# Patient Record
Sex: Female | Born: 1965
Health system: Southern US, Community
[De-identification: ages and names within clinical notes are randomized; demographics above are authoritative.]

## PROBLEM LIST (undated history)

## (undated) DIAGNOSIS — E119 Type 2 diabetes mellitus without complications: Secondary | ICD-10-CM

## (undated) DIAGNOSIS — I10 Essential (primary) hypertension: Secondary | ICD-10-CM

## (undated) HISTORY — PX: TUBAL LIGATION: SHX77

---

## 2003-02-03 ENCOUNTER — Encounter: Payer: Self-pay | Admitting: Emergency Medicine

## 2003-02-03 ENCOUNTER — Emergency Department (HOSPITAL_COMMUNITY): Admission: EM | Admit: 2003-02-03 | Discharge: 2003-02-03 | Payer: Self-pay | Admitting: Emergency Medicine

## 2003-10-12 ENCOUNTER — Emergency Department (HOSPITAL_COMMUNITY): Admission: EM | Admit: 2003-10-12 | Discharge: 2003-10-12 | Payer: Self-pay | Admitting: Emergency Medicine

## 2012-05-03 ENCOUNTER — Encounter (HOSPITAL_COMMUNITY): Payer: Self-pay | Admitting: Emergency Medicine

## 2012-05-03 ENCOUNTER — Emergency Department (HOSPITAL_COMMUNITY)
Admission: EM | Admit: 2012-05-03 | Discharge: 2012-05-03 | Disposition: A | Discharge status: Home / Self Care | Payer: Self-pay | Attending: Emergency Medicine | Admitting: Emergency Medicine

## 2012-05-03 DIAGNOSIS — I10 Essential (primary) hypertension: Secondary | ICD-10-CM | POA: Insufficient documentation

## 2012-05-03 DIAGNOSIS — R112 Nausea with vomiting, unspecified: Secondary | ICD-10-CM | POA: Insufficient documentation

## 2012-05-03 DIAGNOSIS — D649 Anemia, unspecified: Secondary | ICD-10-CM | POA: Insufficient documentation

## 2012-05-03 DIAGNOSIS — N898 Other specified noninflammatory disorders of vagina: Secondary | ICD-10-CM | POA: Insufficient documentation

## 2012-05-03 DIAGNOSIS — N939 Abnormal uterine and vaginal bleeding, unspecified: Secondary | ICD-10-CM

## 2012-05-03 HISTORY — DX: Essential (primary) hypertension: I10

## 2012-05-03 LAB — CBC WITH DIFFERENTIAL/PLATELET
Basophils Absolute: 0 10*3/uL (ref 0.0–0.1)
Basophils Relative: 0 % (ref 0–1)
Eosinophils Absolute: 0 10*3/uL (ref 0.0–0.7)
HCT: 22.4 % — ABNORMAL LOW (ref 36.0–46.0)
Hemoglobin: 6.2 g/dL — CL (ref 12.0–15.0)
Lymphs Abs: 0.7 10*3/uL (ref 0.7–4.0)
MCH: 15.9 pg — ABNORMAL LOW (ref 26.0–34.0)
MCHC: 27.7 g/dL — ABNORMAL LOW (ref 30.0–36.0)
MCV: 57.6 fL — ABNORMAL LOW (ref 78.0–100.0)
Monocytes Absolute: 0.6 10*3/uL (ref 0.1–1.0)
Neutro Abs: 7 10*3/uL (ref 1.7–7.7)
RDW: 21.5 % — ABNORMAL HIGH (ref 11.5–15.5)

## 2012-05-03 LAB — URINALYSIS, ROUTINE W REFLEX MICROSCOPIC
Bilirubin Urine: NEGATIVE
Hgb urine dipstick: NEGATIVE
Ketones, ur: NEGATIVE mg/dL
Protein, ur: NEGATIVE mg/dL
Urobilinogen, UA: 0.2 mg/dL (ref 0.0–1.0)

## 2012-05-03 LAB — COMPREHENSIVE METABOLIC PANEL
AST: 18 U/L (ref 0–37)
Albumin: 3.5 g/dL (ref 3.5–5.2)
BUN: 12 mg/dL (ref 6–23)
Calcium: 9.2 mg/dL (ref 8.4–10.5)
Creatinine, Ser: 0.99 mg/dL (ref 0.50–1.10)
Total Bilirubin: 0.2 mg/dL — ABNORMAL LOW (ref 0.3–1.2)

## 2012-05-03 LAB — LIPASE, BLOOD: Lipase: 26 U/L (ref 11–59)

## 2012-05-03 LAB — OCCULT BLOOD, POC DEVICE: Fecal Occult Bld: POSITIVE

## 2012-05-03 LAB — PREGNANCY, URINE: Preg Test, Ur: NEGATIVE

## 2012-05-03 LAB — GLUCOSE, CAPILLARY: Glucose-Capillary: 84 mg/dL (ref 70–99)

## 2012-05-03 MED ORDER — SODIUM CHLORIDE 0.9 % IV SOLN
Freq: Once | INTRAVENOUS | Status: AC
  Administered 2012-05-03: 11:00:00 via INTRAVENOUS

## 2012-05-03 MED ORDER — SODIUM CHLORIDE 0.9 % IV BOLUS (SEPSIS)
1000.0000 mL | Freq: Once | INTRAVENOUS | Status: AC
  Administered 2012-05-03: 1000 mL via INTRAVENOUS

## 2012-05-03 MED ORDER — ONDANSETRON HCL 4 MG/2ML IJ SOLN
4.0000 mg | Freq: Once | INTRAMUSCULAR | Status: AC
  Administered 2012-05-03: 4 mg via INTRAVENOUS
  Filled 2012-05-03: qty 2

## 2012-05-03 MED ORDER — MEGESTROL ACETATE 40 MG PO TABS: 80.0000 mg | ORAL_TABLET | Freq: Every day | ORAL | Status: DC

## 2012-05-03 MED ORDER — FERUMOXYTOL INJECTION 510 MG/17 ML
510.0000 mg | Freq: Once | INTRAVENOUS | Status: AC
  Administered 2012-05-03: 510 mg via INTRAVENOUS
  Filled 2012-05-03: qty 17

## 2012-05-03 MED ORDER — MORPHINE SULFATE 4 MG/ML IJ SOLN
4.0000 mg | Freq: Once | INTRAMUSCULAR | Status: AC
  Administered 2012-05-03: 4 mg via INTRAVENOUS
  Filled 2012-05-03: qty 1

## 2012-05-03 NOTE — ED Notes (Signed)
CRITICAL VALUE ALERT  Critical value received:  Hgb  Date of notification:  05/03/12  Time of notification:  1150  Critical value read back:yes  Nurse who received alert:  A. Nolen Mu, RN  MD notified (1st page):  Bebe Shaggy   Responding MD: Bebe Shaggy Time MD responded:  [phone]

## 2012-05-03 NOTE — ED Notes (Signed)
Pt ambulated around nurse's station x 1 with no distress.  edp notified.

## 2012-05-03 NOTE — ED Provider Notes (Signed)
History    This chart was scribed for Joya Gaskins, MD, MD by Smitty Pluck. The patient was seen in room APA05 and the patient's care was started at 9:26AM.   CSN: [id number]  Arrival date & time 05/03/12  1478   Chief Complaint  Patient presents with  . Nausea  . Emesis     Patient is a 46 y.o. female presenting with vomiting. The history is provided by the patient. No language interpreter was used.  Emesis  This is a new problem. The current episode started less than 1 hour ago. The problem occurs 2 to 4 times per day. The problem has been gradually improving. The emesis has an appearance of stomach contents. There has been no fever. Associated symptoms include cough. Pertinent negatives include no chills, no diarrhea and no fever.   Laurie Brown is a 46 y.o. female who presents to the Emergency Department complaining of nausea and emesis onset today. Pt reports that she has vomited 3x after persistent cough this AM.  She states that cough started after eating. Pt reports having SOB and diaphoresis. Denies chest pain, diarrhea and dysuria. Pt has hx of HTN (diagnosed 1 day ago at [facility]). Denies MI and CVA. Pt has had tubal ligation but is currently having menstrual cycle. She reports abdominal pain that she thinks is associated with her menstrual cycle. Denies any current pain.   Past Medical History  Diagnosis Date  . Hypertension     Past Surgical History  Procedure Date  . Tubal ligation     History reviewed. No pertinent family history.  History  Substance Use Topics  . Smoking status: Not on file  . Smokeless tobacco: Not on file  . Alcohol Use: No    OB History    Grav Para Term Preterm Abortions TAB SAB Ect Mult Living                  Review of Systems  Constitutional: Positive for diaphoresis. Negative for fever and chills.  Respiratory: Positive for cough.   Gastrointestinal: Positive for nausea and vomiting. Negative for diarrhea.    Genitourinary: Negative for dysuria.  All other systems reviewed and are negative.    Allergies  Review of patient's allergies indicates no known allergies.  Home Medications  No current outpatient prescriptions on file.  BP 153/86  Pulse 111  Temp 98.3 F (36.8 C) (Oral)  Resp 16  SpO2 100%  LMP 05/02/2012  Physical Exam  Nursing note and vitals reviewed. CONSTITUTIONAL: Well developed/well nourished HEAD AND FACE: Normocephalic/atraumatic EYES: EOMI/PERRL, no scleral icterus  ENMT: Mucous membranes dry NECK: supple no meningeal signs SPINE:entire spine nontender CV: S1/S2 noted, no murmurs/rubs/gallops noted LUNGS: Lungs are clear to auscultation bilaterally, no apparent distress ABDOMEN: soft, nontender, no rebound or guarding Rectal - stool color normal, no melena.  Chaperone present GU:no cva tenderness Blood noted in vault.  No active bleeding noted.   NEURO: Pt is awake/alert, moves all extremitiesx4 EXTREMITIES: pulses normal, full ROM SKIN: warm, color normal PSYCH: no abnormalities of mood noted   ED Course  Procedures  DIAGNOSTIC STUDIES: Oxygen Saturation is 100% on room air, normal by my interpretation.    COORDINATION OF CARE: 9:31 AM Discussed ED treatment with pt  10:04 AM    . ondansetron  4 mg Intravenous Once   11:05 AM Pt now having recurrent abd pain.  She reports it is in her upper abdomen.  abd exam benign but will  sends labs.  No lower abd tenderness noted  1:44 PM Pt stable, no hypotension.  She reports mild weakness for "awhile" but is stable Anemia noted.  Likely due to vag bleeding.   Rectal exam revealed no melena/blood (hem positive likely due to blood on skin from vag bleeding) D/w gyn Dr Despina Hidden requests IV iron and start megace 80mg  daily and f/u in one week Pt does not smoke, and no h/o VTE  Pt ambulatory in ED without any distress/weakness Does not require emergent transfusion at this time  MDM  Nursing notes including  past medical history and social history reviewed and considered in documentation Labs/vital reviewed and considered    Date: 05/03/2012  Rate: 94  Rhythm: normal sinus rhythm  QRS Axis: normal  Intervals: normal  ST/T Wave abnormalities: nonspecific ST changes  Conduction Disutrbances:none  Narrative Interpretation:   Old EKG Reviewed: none available at time of interpretation     I personally performed the services described in this documentation, which was scribed in my presence. The recorded information has been reviewed and considered.          Joya Gaskins, MD 05/03/12 [id number]

## 2012-05-03 NOTE — ED Notes (Signed)
Pt woke up with n/v this am.

## 2012-05-03 NOTE — ED Notes (Signed)
Called lab to check on urine pregnancy test.

## 2012-06-05 ENCOUNTER — Emergency Department (HOSPITAL_COMMUNITY)
Admission: EM | Admit: 2012-06-05 | Discharge: 2012-06-05 | Disposition: A | Discharge status: Home / Self Care | Payer: Self-pay | Attending: Emergency Medicine | Admitting: Emergency Medicine

## 2012-06-05 ENCOUNTER — Encounter (HOSPITAL_COMMUNITY): Payer: Self-pay | Admitting: *Deleted

## 2012-06-05 DIAGNOSIS — R109 Unspecified abdominal pain: Secondary | ICD-10-CM | POA: Insufficient documentation

## 2012-06-05 DIAGNOSIS — N949 Unspecified condition associated with female genital organs and menstrual cycle: Secondary | ICD-10-CM | POA: Insufficient documentation

## 2012-06-05 DIAGNOSIS — I10 Essential (primary) hypertension: Secondary | ICD-10-CM | POA: Insufficient documentation

## 2012-06-05 DIAGNOSIS — N939 Abnormal uterine and vaginal bleeding, unspecified: Secondary | ICD-10-CM

## 2012-06-05 DIAGNOSIS — N938 Other specified abnormal uterine and vaginal bleeding: Secondary | ICD-10-CM | POA: Insufficient documentation

## 2012-06-05 DIAGNOSIS — Z79899 Other long term (current) drug therapy: Secondary | ICD-10-CM | POA: Insufficient documentation

## 2012-06-05 LAB — CBC WITH DIFFERENTIAL/PLATELET
Basophils Absolute: 0 10*3/uL (ref 0.0–0.1)
Eosinophils Absolute: 0.2 10*3/uL (ref 0.0–0.7)
Hemoglobin: 8.6 g/dL — ABNORMAL LOW (ref 12.0–15.0)
Lymphocytes Relative: 25 % (ref 12–46)
MCH: 21.8 pg — ABNORMAL LOW (ref 26.0–34.0)
MCHC: 30.1 g/dL (ref 30.0–36.0)
Monocytes Relative: 7 % (ref 3–12)
Neutro Abs: 5.6 10*3/uL (ref 1.7–7.7)
Neutrophils Relative %: 66 % (ref 43–77)
RBC: 3.95 MIL/uL (ref 3.87–5.11)

## 2012-06-05 LAB — BASIC METABOLIC PANEL
Calcium: 9.5 mg/dL (ref 8.4–10.5)
GFR calc Af Amer: 81 mL/min — ABNORMAL LOW (ref >90)
GFR calc non Af Amer: 70 mL/min — ABNORMAL LOW (ref >90)
Glucose, Bld: 93 mg/dL (ref 70–99)
Potassium: 3.6 mEq/L (ref 3.5–5.1)
Sodium: 138 mEq/L (ref 135–145)

## 2012-06-05 MED ORDER — MEGESTROL ACETATE 40 MG PO TABS: ORAL_TABLET | ORAL | Status: DC

## 2012-06-05 NOTE — ED Notes (Signed)
Irregular vag bleeding since 10/18, with clots, Pain low abd.

## 2012-06-05 NOTE — ED Provider Notes (Signed)
History     CSN: [id number]  Arrival date & time 06/05/12  1544   First MD Initiated Contact with Patient 06/05/12 1637      Chief Complaint  Patient presents with  . Vaginal Bleeding    (Consider location/radiation/quality/duration/timing/severity/associated sxs/prior treatment) Patient is a 46 y.o. female presenting with vaginal bleeding. The history is provided by the patient (pt complains fo vaginal bleeding). No language interpreter was used.  Vaginal Bleeding This is a chronic problem. The current episode started more than 2 days ago. The problem occurs constantly. The problem has not changed since onset.Associated symptoms include abdominal pain. Pertinent negatives include no chest pain and no headaches. Nothing aggravates the symptoms. Nothing relieves the symptoms. She has tried nothing for the symptoms. The treatment provided no relief.    Past Medical History  Diagnosis Date  . Hypertension     Past Surgical History  Procedure Date  . Tubal ligation   . Tubal ligation     History reviewed. No pertinent family history.  History  Substance Use Topics  . Smoking status: Not on file  . Smokeless tobacco: Not on file  . Alcohol Use: No    OB History    Grav Para Term Preterm Abortions TAB SAB Ect Mult Living                  Review of Systems  Constitutional: Negative for fatigue.  HENT: Negative for congestion, sinus pressure and ear discharge.   Eyes: Negative for discharge.  Respiratory: Negative for cough.   Cardiovascular: Negative for chest pain.  Gastrointestinal: Positive for abdominal pain. Negative for diarrhea.  Genitourinary: Positive for vaginal bleeding. Negative for frequency and hematuria.  Musculoskeletal: Negative for back pain.  Skin: Negative for rash.  Neurological: Negative for seizures and headaches.  Hematological: Negative.   Psychiatric/Behavioral: Negative for hallucinations.    Allergies  Review of patient's allergies  indicates no known allergies.  Home Medications   Current Outpatient Rx  Name  Route  Sig  Dispense  Refill  . IRON 325 (65 FE) MG PO TABS   Oral   Take 1 tablet by mouth 2 (two) times daily.         . QUINAPRIL-HYDROCHLOROTHIAZIDE 20-25 MG PO TABS   Oral   Take 1 tablet by mouth daily.         Marland Kitchen VITAMIN C 500 MG PO TABS   Oral   Take 500 mg by mouth daily.         . MEGESTROL ACETATE 40 MG PO TABS      Take one pill 3 times a day for 3 days,  Then 2 times a day for 3 days,  Then once a day until finished   45 tablet   0     BP 160/104  Pulse 109  Temp 98.1 F (36.7 C) (Oral)  Resp 20  Ht 5\' 7"  (1.702 m)  Wt 224 lb (101.606 kg)  BMI 35.08 kg/m2  SpO2 100%  LMP 05/03/2012  Physical Exam  Constitutional: She is oriented to person, place, and time. She appears well-developed.  HENT:  Head: Normocephalic and atraumatic.  Eyes: Conjunctivae normal and EOM are normal. No scleral icterus.  Neck: Neck supple. No thyromegaly present.  Cardiovascular: Normal rate and regular rhythm.  Exam reveals no gallop and no friction rub.   No murmur heard. Pulmonary/Chest: No stridor. She has no wheezes. She has no rales. She exhibits no tenderness.  Abdominal: She exhibits  no distension. There is no tenderness. There is no rebound.  Musculoskeletal: Normal range of motion. She exhibits no edema.  Lymphadenopathy:    She has no cervical adenopathy.  Neurological: She is oriented to person, place, and time. Coordination normal.  Skin: No rash noted. No erythema.  Psychiatric: She has a normal mood and affect. Her behavior is normal.    ED Course  Procedures (including critical care time)  Labs Reviewed  CBC WITH DIFFERENTIAL - Abnormal; Notable for the following:    Hemoglobin 8.6 (*)     HCT 28.6 (*)     MCV 72.4 (*)     MCH 21.8 (*)     All other components within normal limits  BASIC METABOLIC PANEL - Abnormal; Notable for the following:    GFR calc non Af Amer 70  (*)     GFR calc Af Amer 81 (*)     All other components within normal limits   No results found.   1. Vaginal bleeding       MDM          Benny Lennert, MD 06/05/12 [phone]

## 2012-06-05 NOTE — ED Notes (Signed)
Pt c/o irregular vaginal bleeding x2 months. Pt states "my period comes and goes". Pt also reports passing of blood clots. Pt denies NVD, chest pain, abdominal pain, lightheadedness or weakness, and unusual urinary symptoms.

## 2012-06-26 DIAGNOSIS — N920 Excessive and frequent menstruation with regular cycle: Secondary | ICD-10-CM | POA: Insufficient documentation

## 2012-10-21 DIAGNOSIS — D250 Submucous leiomyoma of uterus: Secondary | ICD-10-CM | POA: Insufficient documentation

## 2012-10-21 DIAGNOSIS — I10 Essential (primary) hypertension: Secondary | ICD-10-CM | POA: Insufficient documentation

## 2012-10-21 DIAGNOSIS — N9489 Other specified conditions associated with female genital organs and menstrual cycle: Secondary | ICD-10-CM | POA: Insufficient documentation

## 2012-10-21 DIAGNOSIS — D649 Anemia, unspecified: Secondary | ICD-10-CM | POA: Insufficient documentation

## 2013-06-06 DIAGNOSIS — T884XXA Failed or difficult intubation, initial encounter: Secondary | ICD-10-CM | POA: Insufficient documentation

## 2014-01-01 DIAGNOSIS — N8501 Benign endometrial hyperplasia: Secondary | ICD-10-CM | POA: Insufficient documentation

## 2015-04-26 DIAGNOSIS — Z0000 Encounter for general adult medical examination without abnormal findings: Secondary | ICD-10-CM | POA: Insufficient documentation

## 2015-04-26 DIAGNOSIS — E119 Type 2 diabetes mellitus without complications: Secondary | ICD-10-CM | POA: Insufficient documentation

## 2015-06-05 ENCOUNTER — Encounter (HOSPITAL_COMMUNITY): Payer: Self-pay | Admitting: Emergency Medicine

## 2015-06-05 ENCOUNTER — Emergency Department (HOSPITAL_COMMUNITY): Payer: Medicaid Other

## 2015-06-05 ENCOUNTER — Emergency Department (HOSPITAL_COMMUNITY)
Admission: EM | Admit: 2015-06-05 | Discharge: 2015-06-05 | Disposition: A | Discharge status: Home / Self Care | Payer: Medicaid Other | Attending: Emergency Medicine | Admitting: Emergency Medicine

## 2015-06-05 DIAGNOSIS — Z9851 Tubal ligation status: Secondary | ICD-10-CM | POA: Diagnosis not present

## 2015-06-05 DIAGNOSIS — R1011 Right upper quadrant pain: Secondary | ICD-10-CM | POA: Diagnosis not present

## 2015-06-05 DIAGNOSIS — Z79899 Other long term (current) drug therapy: Secondary | ICD-10-CM | POA: Diagnosis not present

## 2015-06-05 DIAGNOSIS — I10 Essential (primary) hypertension: Secondary | ICD-10-CM | POA: Diagnosis not present

## 2015-06-05 DIAGNOSIS — R109 Unspecified abdominal pain: Secondary | ICD-10-CM

## 2015-06-05 LAB — COMPREHENSIVE METABOLIC PANEL
ALBUMIN: 3.9 g/dL (ref 3.5–5.0)
ALT: 21 U/L (ref 14–54)
AST: 20 U/L (ref 15–41)
Alkaline Phosphatase: 55 U/L (ref 38–126)
Anion gap: 8 (ref 5–15)
BUN: 15 mg/dL (ref 6–20)
CHLORIDE: 100 mmol/L — AB (ref 101–111)
CO2: 31 mmol/L (ref 22–32)
CREATININE: 1.1 mg/dL — AB (ref 0.44–1.00)
Calcium: 9.6 mg/dL (ref 8.9–10.3)
GFR calc Af Amer: >60 mL/min (ref >60)
GFR, EST NON AFRICAN AMERICAN: 59 mL/min — AB (ref >60)
GLUCOSE: 75 mg/dL (ref 65–99)
POTASSIUM: 3.4 mmol/L — AB (ref 3.5–5.1)
SODIUM: 139 mmol/L (ref 135–145)
Total Bilirubin: 0.3 mg/dL (ref 0.3–1.2)
Total Protein: 9.3 g/dL — ABNORMAL HIGH (ref 6.5–8.1)

## 2015-06-05 LAB — CBC WITH DIFFERENTIAL/PLATELET
BASOS ABS: 0 10*3/uL (ref 0.0–0.1)
BASOS PCT: 0 %
EOS ABS: 0.2 10*3/uL (ref 0.0–0.7)
EOS PCT: 3 %
HCT: 41.8 % (ref 36.0–46.0)
Hemoglobin: 13.8 g/dL (ref 12.0–15.0)
LYMPHS PCT: 30 %
Lymphs Abs: 2.3 10*3/uL (ref 0.7–4.0)
MCH: 27.2 pg (ref 26.0–34.0)
MCHC: 33 g/dL (ref 30.0–36.0)
MCV: 82.4 fL (ref 78.0–100.0)
MONO ABS: 0.8 10*3/uL (ref 0.1–1.0)
Monocytes Relative: 10 %
Neutro Abs: 4.4 10*3/uL (ref 1.7–7.7)
Neutrophils Relative %: 57 %
PLATELETS: 307 10*3/uL (ref 150–400)
RBC: 5.07 MIL/uL (ref 3.87–5.11)
RDW: 15.4 % (ref 11.5–15.5)
WBC: 7.7 10*3/uL (ref 4.0–10.5)

## 2015-06-05 LAB — LIPASE, BLOOD: LIPASE: 34 U/L (ref 11–51)

## 2015-06-05 MED ORDER — GI COCKTAIL ~~LOC~~
30.0000 mL | Freq: Once | ORAL | Status: AC
  Administered 2015-06-05: 30 mL via ORAL
  Filled 2015-06-05: qty 30

## 2015-06-05 MED ORDER — SODIUM CHLORIDE 0.9 % IV BOLUS (SEPSIS)
1000.0000 mL | Freq: Once | INTRAVENOUS | Status: AC
  Administered 2015-06-05: 1000 mL via INTRAVENOUS

## 2015-06-05 MED ORDER — FENTANYL CITRATE (PF) 100 MCG/2ML IJ SOLN
100.0000 ug | Freq: Once | INTRAMUSCULAR | Status: AC
  Administered 2015-06-05: 100 ug via INTRAVENOUS
  Filled 2015-06-05: qty 2

## 2015-06-05 NOTE — ED Provider Notes (Signed)
CSN: [id number]     Arrival date & time 06/05/15  1328 History   First MD Initiated Contact with Patient 06/05/15 1355     Chief Complaint  Patient presents with  . Abdominal Pain     (Consider location/radiation/quality/duration/timing/severity/associated sxs/prior Treatment) HPI  1 day of intermittent moderate gnawing aching pain mostly in right upper quadrant with onset at rest. Intermittently worsening to as high as 8 (where it is now) but sometimes will improve but not go away. No h/o same. No associated nausea, vomiting, diarrhea, constipation, cough, fever, shortness of breath, rashes or trauma.   Past Medical History  Diagnosis Date  . Hypertension    Past Surgical History  Procedure Laterality Date  . Tubal ligation    . Tubal ligation     No family history on file. Social History  Substance Use Topics  . Smoking status: Never Smoker   . Smokeless tobacco: None  . Alcohol Use: No   OB History    No data available     Review of Systems  Constitutional: Negative for fever, chills, activity change, appetite change and fatigue.  HENT: Negative for congestion and facial swelling.   Eyes: Negative for discharge and redness.  Respiratory: Negative for cough and shortness of breath.   Cardiovascular: Negative for chest pain.  Gastrointestinal: Positive for abdominal pain (RUQ). Negative for nausea, vomiting, diarrhea, constipation, blood in stool and abdominal distention.  Endocrine: Negative for polydipsia.  Genitourinary: Negative for dysuria and pelvic pain.  Musculoskeletal: Negative for myalgias and back pain.  Skin: Negative for rash and wound.  Neurological: Negative for headaches.  All other systems reviewed and are negative.     Allergies  Lisinopril  Home Medications   Prior to Admission medications   Medication Sig Start Date End Date Taking? Authorizing Provider  amLODipine (NORVASC) 5 MG tablet Take 1 tablet by mouth daily. 05/20/15  Yes  Historical Provider, MD  bismuth subsalicylate (PEPTO BISMOL) 262 MG/15ML suspension Take 30 mLs by mouth every 6 (six) hours as needed (abdominal pain).   Yes Historical Provider, MD  metoprolol (LOPRESSOR) 100 MG tablet Take 1 tablet by mouth 2 (two) times daily. 04/09/15  Yes Historical Provider, MD  triamterene-hydrochlorothiazide (MAXZIDE-25) 37.5-25 MG tablet Take 1 tablet by mouth daily. 04/19/15  Yes Historical Provider, MD  megestrol (MEGACE) 40 MG tablet Take one pill 3 times a day for 3 days,  Then 2 times a day for 3 days,  Then once a day until finished Patient not taking: Reported on 06/05/2015 06/05/12   Milton Ferguson, MD   BP 122/69 mmHg  Pulse 72  Temp(Src) 98 F (36.7 C) (Oral)  Resp 15  Ht 5\' 7"  (1.702 m)  Wt 263 lb (119.296 kg)  BMI 41.18 kg/m2  SpO2 95% Physical Exam  Constitutional: She is oriented to person, place, and time. She appears well-developed and well-nourished.  HENT:  Head: Normocephalic and atraumatic.  Neck: Normal range of motion.  Cardiovascular: Normal rate and regular rhythm.   Pulmonary/Chest: Effort normal and breath sounds normal. No stridor. No respiratory distress. She has no wheezes. She has no rales.  Abdominal: Soft. Bowel sounds are normal. She exhibits no distension. There is tenderness (RUQ). There is no rebound and no guarding.  Musculoskeletal: Normal range of motion. She exhibits no edema or tenderness.  Neurological: She is alert and oriented to person, place, and time.  Skin: Skin is warm and dry. No rash noted.  Nursing note and vitals reviewed.  ED Course  Procedures (including critical care time) Labs Review Labs Reviewed  COMPREHENSIVE METABOLIC PANEL - Abnormal; Notable for the following:    Potassium 3.4 (*)    Chloride 100 (*)    Creatinine, Ser 1.10 (*)    Total Protein 9.3 (*)    GFR calc non Af Amer 59 (*)    All other components within normal limits  CBC WITH DIFFERENTIAL/PLATELET  LIPASE, BLOOD    Imaging  Review Dg Chest 2 View  06/05/2015  CLINICAL DATA:  Right upper quadrant pain.  Evaluate for pneumonia. EXAM: CHEST  2 VIEW COMPARISON:  None. FINDINGS: Normal cardiac and mediastinal contours. No consolidative pulmonary opacities. No pleural effusion or pneumothorax. Regional skeleton is unremarkable. IMPRESSION: No acute cardiopulmonary process. Electronically Signed   By: Lovey Newcomer M.D.   On: 06/05/2015 15:24   I have personally reviewed and evaluated these images and lab results as part of my medical decision-making.   EKG Interpretation None      MDM   Final diagnoses:  Abdominal pain, unspecified abdominal location   Will check labs and treat symptomatically. Will eval for hepatobiliary, pancreatic pathology. Possibly pneumonia in rlq but no other associated symptoms so will check xr.   Symptoms improved in the emergency department. Abdominal exam still benign. Labs without any acute findings. Not able to get ultrasound today so we'll order one as an outpatient since she returned. Doubt she has acute cholecystitis anyway this could be biliary colic so she will come back on Monday for an ultrasound.    Merrily Pew, MD 06/05/15 [phone]

## 2015-06-05 NOTE — ED Notes (Signed)
Pt c/o mid, upper abdominal pain since yesterday. Denies n/v/d. Denies urinary symptoms.

## 2015-06-06 ENCOUNTER — Inpatient Hospital Stay (HOSPITAL_COMMUNITY): Admit: 2015-06-06 | Payer: Medicaid Other

## 2015-08-24 ENCOUNTER — Encounter (HOSPITAL_COMMUNITY): Payer: Self-pay

## 2015-08-24 ENCOUNTER — Emergency Department (HOSPITAL_COMMUNITY)
Admission: EM | Admit: 2015-08-24 | Discharge: 2015-08-24 | Disposition: A | Discharge status: Home / Self Care | Payer: Medicaid Other | Attending: Emergency Medicine | Admitting: Emergency Medicine

## 2015-08-24 DIAGNOSIS — J069 Acute upper respiratory infection, unspecified: Secondary | ICD-10-CM

## 2015-08-24 DIAGNOSIS — I10 Essential (primary) hypertension: Secondary | ICD-10-CM | POA: Insufficient documentation

## 2015-08-24 DIAGNOSIS — J018 Other acute sinusitis: Secondary | ICD-10-CM

## 2015-08-24 DIAGNOSIS — Z79899 Other long term (current) drug therapy: Secondary | ICD-10-CM | POA: Insufficient documentation

## 2015-08-24 DIAGNOSIS — R05 Cough: Secondary | ICD-10-CM | POA: Diagnosis present

## 2015-08-24 MED ORDER — DEXAMETHASONE 4 MG PO TABS: 4.0000 mg | ORAL_TABLET | Freq: Two times a day (BID) | ORAL | Status: DC

## 2015-08-24 MED ORDER — LORATADINE-PSEUDOEPHEDRINE ER 5-120 MG PO TB12: 1.0000 | ORAL_TABLET | Freq: Two times a day (BID) | ORAL | Status: DC

## 2015-08-24 MED ORDER — AMOXICILLIN 500 MG PO CAPS: 500.0000 mg | ORAL_CAPSULE | Freq: Three times a day (TID) | ORAL | Status: DC

## 2015-08-24 MED ORDER — HYDROCOD POLST-CPM POLST ER 10-8 MG/5ML PO SUER
5.0000 mL | Freq: Once | ORAL | Status: AC
  Administered 2015-08-24: 5 mL via ORAL
  Filled 2015-08-24: qty 5

## 2015-08-24 MED ORDER — PSEUDOEPHEDRINE-CODEINE-GG 30-10-100 MG/5ML PO SOLN
10.0000 mL | Freq: Four times a day (QID) | ORAL | Status: DC | PRN
Start: 2015-08-24 — End: 2016-05-27

## 2015-08-24 MED ORDER — ONDANSETRON HCL 4 MG PO TABS
4.0000 mg | ORAL_TABLET | Freq: Once | ORAL | Status: AC
  Administered 2015-08-24: 4 mg via ORAL
  Filled 2015-08-24: qty 1

## 2015-08-24 MED ORDER — PREDNISONE 50 MG PO TABS
50.0000 mg | ORAL_TABLET | Freq: Once | ORAL | Status: AC
  Administered 2015-08-24: 50 mg via ORAL
  Filled 2015-08-24: qty 1

## 2015-08-24 MED ORDER — AMOXICILLIN 250 MG PO CAPS
500.0000 mg | ORAL_CAPSULE | Freq: Once | ORAL | Status: AC
  Administered 2015-08-24: 500 mg via ORAL
  Filled 2015-08-24: qty 2

## 2015-08-24 NOTE — Discharge Instructions (Signed)
Sinusitis, Adult °Sinusitis is redness, soreness, and puffiness (inflammation) of the air pockets in the bones of your face (sinuses). The redness, soreness, and puffiness can cause air and mucus to get trapped in your sinuses. This can allow germs to grow and cause an infection.  °HOME CARE  °· Drink enough fluids to keep your pee (urine) clear or pale yellow. °· Use a humidifier in your home. °· Run a hot shower to create steam in the bathroom. Sit in the bathroom with the door closed. Breathe in the steam 3-4 times a day. °· Put a warm, moist washcloth on your face 3-4 times a day, or as told by your doctor. °· Use salt water sprays (saline sprays) to wet the thick fluid in your nose. This can help the sinuses drain. °· Only take medicine as told by your doctor. °GET HELP RIGHT AWAY IF:  °· Your pain gets worse. °· You have very bad headaches. °· You are sick to your stomach (nauseous). °· You throw up (vomit). °· You are very sleepy (drowsy) all the time. °· Your face is puffy (swollen). °· Your vision changes. °· You have a stiff neck. °· You have trouble breathing. °MAKE SURE YOU:  °· Understand these instructions. °· Will watch your condition. °· Will get help right away if you are not doing well or get worse. °  °This information is not intended to replace advice given to you by your health care provider. Make sure you discuss any questions you have with your health care provider. °  °Document Released: 12/20/2007 Document Revised: 07/24/2014 Document Reviewed: 02/06/2012 °Elsevier Interactive Patient Education ©2016 [employer] ° °Upper Respiratory Infection, Adult °Most upper respiratory infections (URIs) are caused by a virus. A URI affects the nose, throat, and upper air passages. The most common type of URI is often called "the common cold." °HOME CARE  °· Take medicines only as told by your doctor. °· Gargle warm saltwater or take cough drops to comfort your throat as told by your doctor. °· Use a  warm mist humidifier or inhale steam from a shower to increase air moisture. This may make it easier to breathe. °· Drink enough fluid to keep your pee (urine) clear or pale yellow. °· Eat soups and other clear broths. °· Have a healthy diet. °· Rest as needed. °· Go back to work when your fever is gone or your doctor says it is okay. °¨ You may need to stay home longer to avoid giving your URI to others. °¨ You can also wear a face mask and wash your hands often to prevent spread of the virus. °· Use your inhaler more if you have asthma. °· Do not use any tobacco products, including cigarettes, chewing tobacco, or electronic cigarettes. If you need help quitting, ask your doctor. °GET HELP IF: °· You are getting worse, not better. °· Your symptoms are not helped by medicine. °· You have chills. °· You are getting more short of breath. °· You have brown or red mucus. °· You have yellow or brown discharge from your nose. °· You have pain in your face, especially when you bend forward. °· You have a fever. °· You have puffy (swollen) neck glands. °· You have pain while swallowing. °· You have white areas in the back of your throat. °GET HELP RIGHT AWAY IF:  °· You have very bad or constant: °¨ Headache. °¨ Ear pain. °¨ Pain in your forehead, behind your eyes, and over   your cheekbones (sinus pain). °¨ Chest pain. °· You have long-lasting (chronic) lung disease and any of the following: °¨ Wheezing. °¨ Long-lasting cough. °¨ Coughing up blood. °¨ A change in your usual mucus. °· You have a stiff neck. °· You have changes in your: °¨ Vision. °¨ Hearing. °¨ Thinking. °¨ Mood. °MAKE SURE YOU:  °· Understand these instructions. °· Will watch your condition. °· Will get help right away if you are not doing well or get worse. °  °This information is not intended to replace advice given to you by your health care provider. Make sure you discuss any questions you have with your health care provider. °  °Document Released:  12/20/2007 Document Revised: 11/17/2014 Document Reviewed: 10/08/2013 °Elsevier Interactive Patient Education ©2016 [employer] ° °

## 2015-08-24 NOTE — ED Notes (Signed)
Pt reports cough, productive at times and nasal congestion since Monday.  Unknown if has had fever.

## 2015-08-24 NOTE — ED Provider Notes (Signed)
CSN: [id number]     Arrival date & time 08/24/15  1052 History   First MD Initiated Contact with Patient 08/24/15 1118     Chief Complaint  Patient presents with  . Cough     (Consider location/radiation/quality/duration/timing/severity/associated sxs/prior Treatment) Patient is a 50 y.o. female presenting with cough. The history is provided by the patient.  Cough Cough characteristics:  Non-productive Severity:  Moderate Onset quality:  Gradual Duration:  2 days Progression:  Worsening Chronicity:  New Smoker: no   Context: weather changes   Context: not fumes and not sick contacts   Relieved by:  Nothing Ineffective treatments:  Rest and fluids Associated symptoms: chills, myalgias, rhinorrhea, sinus congestion, sore throat and wheezing   Associated symptoms: no ear pain, no fever and no headaches     Past Medical History  Diagnosis Date  . Hypertension    Past Surgical History  Procedure Laterality Date  . Tubal ligation    . Tubal ligation     No family history on file. Social History  Substance Use Topics  . Smoking status: Never Smoker   . Smokeless tobacco: None  . Alcohol Use: No   OB History    No data available     Review of Systems  Constitutional: Positive for chills. Negative for fever.  HENT: Positive for congestion, rhinorrhea and sore throat. Negative for ear pain.   Respiratory: Positive for cough and wheezing.   Musculoskeletal: Positive for myalgias.  Neurological: Negative for headaches.  All other systems reviewed and are negative.     Allergies  Lisinopril  Home Medications   Prior to Admission medications   Medication Sig Start Date End Date Taking? Authorizing Provider  amLODipine (NORVASC) 5 MG tablet Take 1 tablet by mouth daily. 05/20/15   Historical Provider, MD  bismuth subsalicylate (PEPTO BISMOL) 262 MG/15ML suspension Take 30 mLs by mouth every 6 (six) hours as needed (abdominal pain).    Historical Provider, MD   megestrol (MEGACE) 40 MG tablet Take one pill 3 times a day for 3 days,  Then 2 times a day for 3 days,  Then once a day until finished Patient not taking: Reported on 06/05/2015 06/05/12   Milton Ferguson, MD  metoprolol (LOPRESSOR) 100 MG tablet Take 1 tablet by mouth 2 (two) times daily. 04/09/15   Historical Provider, MD  triamterene-hydrochlorothiazide (MAXZIDE-25) 37.5-25 MG tablet Take 1 tablet by mouth daily. 04/19/15   Historical Provider, MD   BP 148/79 mmHg  Pulse 83  Temp(Src) 98.7 F (37.1 C) (Oral)  Resp 22  Ht 5\' 7"  (1.702 m)  Wt 119.296 kg  BMI 41.18 kg/m2  SpO2 99% Physical Exam  Constitutional: She is oriented to person, place, and time. She appears well-developed and well-nourished.  Non-toxic appearance.  HENT:  Head: Normocephalic.  Right Ear: Tympanic membrane and external ear normal.  Left Ear: Tympanic membrane and external ear normal.  Nasal congestion present. Mild increase redness of the posterior pharynx. Uvula enlarged.  Eyes: EOM and lids are normal. Pupils are equal, round, and reactive to light.  Neck: Normal range of motion. Neck supple. Carotid bruit is not present.  Cardiovascular: Normal rate, regular rhythm, normal heart sounds, intact distal pulses and normal pulses.   Pulmonary/Chest: No respiratory distress. She has no wheezes.  Abdominal: Soft. Bowel sounds are normal. There is no tenderness. There is no guarding.  Musculoskeletal: Normal range of motion.  Lymphadenopathy:       Head (right side): No submandibular adenopathy  present.       Head (left side): No submandibular adenopathy present.    She has no cervical adenopathy.  Neurological: She is alert and oriented to person, place, and time. She has normal strength. No cranial nerve deficit or sensory deficit.  Skin: Skin is warm and dry.  Psychiatric: She has a normal mood and affect. Her speech is normal.  Nursing note and vitals reviewed.   ED Course  Procedures (including critical  care time) Labs Review Labs Reviewed - No data to display  Imaging Review No results found. I have personally reviewed and evaluated these images and lab results as part of my medical decision-making.   EKG Interpretation None      MDM  Vital signs are well within normal limits. The examination favors sinusitis and upper respiratory infection. The patient will be treated with Amoxil, Claritin-D, Decadron, and Robitussin DAC. Discussed with patient the importance of increasing fluids, wash hands frequently, and protecting others from this illness. The patient is in agreement with this discharge plan, and will follow-up with her excess care physician or return to the emergency department if any changes, problems, or concerns.    Final diagnoses:  Other acute sinusitis  URI (upper respiratory infection)    **I have reviewed nursing notes, vital signs, and all appropriate lab and imaging results for this patient.Lily Kocher, PA-C 08/24/15 1202  Dorie Rank, MD 08/24/15 1600

## 2015-12-03 ENCOUNTER — Emergency Department (HOSPITAL_COMMUNITY)
Admission: EM | Admit: 2015-12-03 | Discharge: 2015-12-03 | Disposition: A | Discharge status: Home / Self Care | Payer: Medicaid Other | Attending: Emergency Medicine | Admitting: Emergency Medicine

## 2015-12-03 ENCOUNTER — Emergency Department (HOSPITAL_COMMUNITY): Payer: Medicaid Other

## 2015-12-03 ENCOUNTER — Encounter (HOSPITAL_COMMUNITY): Payer: Self-pay | Admitting: Emergency Medicine

## 2015-12-03 DIAGNOSIS — I10 Essential (primary) hypertension: Secondary | ICD-10-CM | POA: Diagnosis not present

## 2015-12-03 DIAGNOSIS — B349 Viral infection, unspecified: Secondary | ICD-10-CM | POA: Insufficient documentation

## 2015-12-03 DIAGNOSIS — Z79899 Other long term (current) drug therapy: Secondary | ICD-10-CM | POA: Diagnosis not present

## 2015-12-03 DIAGNOSIS — B9789 Other viral agents as the cause of diseases classified elsewhere: Secondary | ICD-10-CM

## 2015-12-03 DIAGNOSIS — R509 Fever, unspecified: Secondary | ICD-10-CM | POA: Diagnosis present

## 2015-12-03 DIAGNOSIS — J988 Other specified respiratory disorders: Secondary | ICD-10-CM

## 2015-12-03 MED ORDER — ACETAMINOPHEN 500 MG PO TABS
1000.0000 mg | ORAL_TABLET | Freq: Once | ORAL | Status: AC
  Administered 2015-12-03: 1000 mg via ORAL
  Filled 2015-12-03: qty 2

## 2015-12-03 MED ORDER — IBUPROFEN 400 MG PO TABS
400.0000 mg | ORAL_TABLET | Freq: Once | ORAL | Status: AC
  Administered 2015-12-03: 400 mg via ORAL
  Filled 2015-12-03: qty 1

## 2015-12-03 MED ORDER — GUAIFENESIN-CODEINE 100-10 MG/5ML PO SOLN: 10.0000 mL | Freq: Three times a day (TID) | ORAL | Status: DC | PRN

## 2015-12-03 NOTE — Discharge Instructions (Signed)
Your chest x-ray does not show pneumonia and you do not need antibiotics. This is likely a virus. Get plenty of rest and keep well-hydrated. Using take over-the-counter decongestions such as Mucinex. Take Tylenol and Motrin as needed for pain control and fever.  Viral Infections A viral infection can be caused by different types of viruses.Most viral infections are not serious and resolve on their own. However, some infections may cause severe symptoms and may lead to further complications. SYMPTOMS Viruses can frequently cause:  Minor sore throat.  Aches and pains.  Headaches.  Runny nose.  Different types of rashes.  Watery eyes.  Tiredness.  Cough.  Loss of appetite.  Gastrointestinal infections, resulting in nausea, vomiting, and diarrhea. These symptoms do not respond to antibiotics because the infection is not caused by bacteria. However, you might catch a bacterial infection following the viral infection. This is sometimes called a "superinfection." Symptoms of such a bacterial infection may include:  Worsening sore throat with pus and difficulty swallowing.  Swollen neck glands.  Chills and a high or persistent fever.  Severe headache.  Tenderness over the sinuses.  Persistent overall ill feeling (malaise), muscle aches, and tiredness (fatigue).  Persistent cough.  Yellow, green, or brown mucus production with coughing. HOME CARE INSTRUCTIONS   Only take over-the-counter or prescription medicines for pain, discomfort, diarrhea, or fever as directed by your caregiver.  Drink enough water and fluids to keep your urine clear or pale yellow. Sports drinks can provide valuable electrolytes, sugars, and hydration.  Get plenty of rest and maintain proper nutrition. Soups and broths with crackers or rice are fine. SEEK IMMEDIATE MEDICAL CARE IF:   You have severe headaches, shortness of breath, chest pain, neck pain, or an unusual rash.  You have uncontrolled  vomiting, diarrhea, or you are unable to keep down fluids.  You or your child has an oral temperature above 102 F (38.9 C), not controlled by medicine.  Your baby is older than 3 months with a rectal temperature of 102 F (38.9 C) or higher.  Your baby is 85 months old or younger with a rectal temperature of 100.4 F (38 C) or higher. MAKE SURE YOU:   Understand these instructions.  Will watch your condition.  Will get help right away if you are not doing well or get worse.   This information is not intended to replace advice given to you by your health care provider. Make sure you discuss any questions you have with your health care provider.   Document Released: 04/12/2005 Document Revised: 09/25/2011 Document Reviewed: 12/09/2014 Elsevier Interactive Patient Education [employer].

## 2015-12-03 NOTE — ED Provider Notes (Signed)
CSN: [id number]     Arrival date & time 12/03/15  [id number] History  By signing my name below, I, Laurie Brown, attest that this documentation has been prepared under the direction and in the presence of Laurie Dandy, MD. Electronically Signed: Judithann Brown, ED Scribe. 12/03/2015. 9:35 AM.    Chief Complaint  Patient presents with  . Fever   The history is provided by the patient. No language interpreter was used.   HPI Comments: Laurie Brown is a 50 y.o. female with a hx of HTN who presents to the Emergency Department complaining of gradually worsening persistent intermittent non-productive cough onset yesterday. Pt reports associated fever and nasal congestion. No alleviating factors noted. Pt has not tried any medications PTA. She denies any sick contacts. Pt also denies any current generalized body aches, rhinorrhea, sore throat, abdominal pain, or n/v/d.    Past Medical History  Diagnosis Date  . Hypertension    Past Surgical History  Procedure Laterality Date  . Tubal ligation    . Tubal ligation     History reviewed. No pertinent family history. Social History  Substance Use Topics  . Smoking status: Never Smoker   . Smokeless tobacco: None  . Alcohol Use: No   OB History    No data available     Review of Systems  Constitutional: Positive for fever.  HENT: Positive for congestion. Negative for rhinorrhea and sore throat.   Respiratory: Positive for cough.   Gastrointestinal: Negative for nausea, vomiting, abdominal pain and diarrhea.  Musculoskeletal: Negative for myalgias.  All other systems reviewed and are negative.     Allergies  Lisinopril  Home Medications   Prior to Admission medications   Medication Sig Start Date End Date Taking? Authorizing Provider  metoprolol (LOPRESSOR) 100 MG tablet Take 1 tablet by mouth 2 (two) times daily. 04/09/15  Yes Historical Provider, MD  triamterene-hydrochlorothiazide (MAXZIDE-25) 37.5-25 MG tablet Take 1  tablet by mouth daily. 04/19/15  Yes Historical Provider, MD  amLODipine (NORVASC) 5 MG tablet Take 1 tablet by mouth daily. 05/20/15   Historical Provider, MD  amoxicillin (AMOXIL) 500 MG capsule Take 1 capsule (500 mg total) by mouth 3 (three) times daily. 08/24/15   Laurie Kocher, PA-C  bismuth subsalicylate (PEPTO BISMOL) 262 MG/15ML suspension Take 30 mLs by mouth every 6 (six) hours as needed (abdominal pain).    Historical Provider, MD  dexamethasone (DECADRON) 4 MG tablet Take 1 tablet (4 mg total) by mouth 2 (two) times daily with a meal. 08/24/15   Laurie Kocher, PA-C  guaiFENesin-codeine 100-10 MG/5ML syrup Take 10 mLs by mouth 3 (three) times daily as needed for cough. 12/03/15   Laurie Dandy, MD  loratadine-pseudoephedrine (CLARITIN-D 12 HOUR) 5-120 MG tablet Take 1 tablet by mouth 2 (two) times daily. 08/24/15   Laurie Kocher, PA-C  megestrol (MEGACE) 40 MG tablet Take one pill 3 times a day for 3 days,  Then 2 times a day for 3 days,  Then once a day until finished Patient not taking: Reported on 06/05/2015 06/05/12   Laurie Ferguson, MD  pseudoephedrine-codeine-guaifenesin (MYTUSSIN DAC) 30-10-100 MG/5ML solution Take 10 mLs by mouth 4 (four) times daily as needed for cough. 08/24/15   Laurie Kocher, PA-C   BP 148/82 mmHg  Pulse 94  Temp(Src) 100.2 F (37.9 C) (Oral)  Resp 20  Ht 5\' 7"  (1.702 m)  Wt 270 lb (122.471 kg)  BMI 42.28 kg/m2  SpO2 98% Physical Exam Physical Exam  Nursing note  and vitals reviewed. Constitutional: Well developed, well nourished, non-toxic, and in no acute distress Head: Normocephalic and atraumatic.  Mouth/Throat: Oropharynx is clear and moist.  Neck: Normal range of motion. Neck supple.  Cardiovascular: Normal rate and regular rhythm.   Pulmonary/Chest: Effort normal and breath sounds normal.  Abdominal: Soft. There is no tenderness. There is no rebound and no guarding.  Musculoskeletal: Normal range of motion.  Neurological: Alert, no facial droop, fluent  speech, moves all extremities symmetrically Skin: Skin is warm and dry.  Psychiatric: Cooperative  ED Course  Procedures (including critical care time) DIAGNOSTIC STUDIES: Oxygen Saturation is 97% on RA, normal by my interpretation.    COORDINATION OF CARE: 9:14 AM- Pt advised of plan for treatment and pt agrees. Pt informed of chest x-ray results. Explained that her viral symptoms will not respond to antibiotics and pt will need supportive care. Advised to use Mucinex for congestion. Return precautions discussed with increase fever, CP, or SOB.    Imaging Review Dg Chest 2 View  12/03/2015  CLINICAL DATA:  Cough with congestion and fever for 1 day EXAM: CHEST  2 VIEW COMPARISON:  June 05, 2015 FINDINGS: There is no edema or consolidation. The heart size and pulmonary vascularity are normal. No adenopathy. No pneumothorax. No bone lesions. IMPRESSION: No edema or consolidation. Electronically Signed   By: Lowella Grip III M.D.   On: 12/03/2015 08:47   Laurie Dandy, MD has personally reviewed and evaluated these images and lab results as part of her medical decision-making.   EKG Interpretation None      MDM   Final diagnoses:  Viral respiratory illness    50 year old female who presents with fever and cough. Presentation consistent with that of viral respiratory illness. Chest x-ray without cardiopulmonary processes or infiltrates. Antibiotics not indicated at this time. With fever here and given tylenol. Otherwise well appearing and in no acute distress with stable vital signs. Exam benign. Discussed supportive care management for home. Strict return and follow-up instructions are reviewed. She expressed understanding of discharge instructions and felt comfortable with plan of care.  I personally performed the services described in this documentation, which was scribed in my presence. The recorded information has been reviewed and is accurate.   Laurie Dandy, MD 12/03/15  [phone]

## 2015-12-03 NOTE — ED Notes (Signed)
62 yof otherwise healthy presents with generalized body aches and fever since yesterday. +runny nose, cough, congestion, headache.  Tmax 102.  Has not taken tylenol/ibuprofen.   Denies abd pain, n/v/d, no urinary or vag symptoms.

## 2016-05-27 ENCOUNTER — Emergency Department (HOSPITAL_COMMUNITY)
Admission: EM | Admit: 2016-05-27 | Discharge: 2016-05-27 | Disposition: A | Discharge status: Home / Self Care | Payer: Medicaid Other | Attending: Emergency Medicine | Admitting: Emergency Medicine

## 2016-05-27 ENCOUNTER — Encounter (HOSPITAL_COMMUNITY): Payer: Self-pay | Admitting: Emergency Medicine

## 2016-05-27 DIAGNOSIS — I10 Essential (primary) hypertension: Secondary | ICD-10-CM | POA: Insufficient documentation

## 2016-05-27 DIAGNOSIS — N390 Urinary tract infection, site not specified: Secondary | ICD-10-CM | POA: Insufficient documentation

## 2016-05-27 DIAGNOSIS — Z7982 Long term (current) use of aspirin: Secondary | ICD-10-CM | POA: Insufficient documentation

## 2016-05-27 DIAGNOSIS — Z79899 Other long term (current) drug therapy: Secondary | ICD-10-CM | POA: Insufficient documentation

## 2016-05-27 DIAGNOSIS — R51 Headache: Secondary | ICD-10-CM | POA: Insufficient documentation

## 2016-05-27 LAB — URINE MICROSCOPIC-ADD ON

## 2016-05-27 LAB — URINALYSIS, ROUTINE W REFLEX MICROSCOPIC
Bilirubin Urine: NEGATIVE
Glucose, UA: NEGATIVE mg/dL
Ketones, ur: NEGATIVE mg/dL
LEUKOCYTES UA: NEGATIVE
NITRITE: NEGATIVE
PROTEIN: 30 mg/dL — AB
SPECIFIC GRAVITY, URINE: 1.02 (ref 1.005–1.030)
pH: 6 (ref 5.0–8.0)

## 2016-05-27 LAB — COMPREHENSIVE METABOLIC PANEL
ALT: 47 U/L (ref 14–54)
ANION GAP: 8 (ref 5–15)
AST: 33 U/L (ref 15–41)
Albumin: 4.1 g/dL (ref 3.5–5.0)
Alkaline Phosphatase: 65 U/L (ref 38–126)
BILIRUBIN TOTAL: 0.5 mg/dL (ref 0.3–1.2)
BUN: 13 mg/dL (ref 6–20)
CHLORIDE: 102 mmol/L (ref 101–111)
CO2: 29 mmol/L (ref 22–32)
Calcium: 10.1 mg/dL (ref 8.9–10.3)
Creatinine, Ser: 1.09 mg/dL — ABNORMAL HIGH (ref 0.44–1.00)
GFR calc Af Amer: >60 mL/min (ref >60)
GFR, EST NON AFRICAN AMERICAN: 59 mL/min — AB (ref >60)
Glucose, Bld: 104 mg/dL — ABNORMAL HIGH (ref 65–99)
POTASSIUM: 3.5 mmol/L (ref 3.5–5.1)
Sodium: 139 mmol/L (ref 135–145)
TOTAL PROTEIN: 9.1 g/dL — AB (ref 6.5–8.1)

## 2016-05-27 LAB — CBC WITH DIFFERENTIAL/PLATELET
BASOS ABS: 0 10*3/uL (ref 0.0–0.1)
BASOS PCT: 0 %
EOS PCT: 0 %
Eosinophils Absolute: 0 10*3/uL (ref 0.0–0.7)
HEMATOCRIT: 42 % (ref 36.0–46.0)
Hemoglobin: 13.9 g/dL (ref 12.0–15.0)
LYMPHS PCT: 19 %
Lymphs Abs: 1.8 10*3/uL (ref 0.7–4.0)
MCH: 26.7 pg (ref 26.0–34.0)
MCHC: 33.1 g/dL (ref 30.0–36.0)
MCV: 80.8 fL (ref 78.0–100.0)
MONO ABS: 0.5 10*3/uL (ref 0.1–1.0)
MONOS PCT: 6 %
Neutro Abs: 6.9 10*3/uL (ref 1.7–7.7)
Neutrophils Relative %: 75 %
PLATELETS: 358 10*3/uL (ref 150–400)
RBC: 5.2 MIL/uL — ABNORMAL HIGH (ref 3.87–5.11)
RDW: 14.9 % (ref 11.5–15.5)
WBC: 9.3 10*3/uL (ref 4.0–10.5)

## 2016-05-27 LAB — PREGNANCY, URINE: PREG TEST UR: NEGATIVE

## 2016-05-27 MED ORDER — ONDANSETRON 4 MG PO TBDP: 4.0000 mg | ORAL_TABLET | Freq: Three times a day (TID) | ORAL | 0 refills | Status: DC | PRN

## 2016-05-27 MED ORDER — ONDANSETRON 4 MG PO TBDP
4.0000 mg | ORAL_TABLET | Freq: Once | ORAL | Status: AC
  Administered 2016-05-27: 4 mg via ORAL
  Filled 2016-05-27: qty 1

## 2016-05-27 MED ORDER — CEPHALEXIN 500 MG PO CAPS: 500.0000 mg | ORAL_CAPSULE | Freq: Four times a day (QID) | ORAL | 0 refills | Status: DC

## 2016-05-27 NOTE — ED Notes (Signed)
Instructed pt to take all of antibiotics as prescribed. 

## 2016-05-27 NOTE — ED Provider Notes (Signed)
[facility] DEPT Provider Note   CSN: [id number] Arrival date & time: 05/27/16  1006     History   Chief Complaint Chief Complaint  Patient presents with  . Headache    HPI Laurie Brown is a 50 y.o. female.  The history is provided by the patient. No language interpreter was used.  Headache   This is a new problem. The problem occurs constantly. The problem has been gradually worsening. The headache is associated with nothing. The quality of the pain is described as dull. The pain is moderate. The pain does not radiate. She has tried nothing for the symptoms.   Pt reports she awoke feeling bad this am.  Pt reports she had had a headache and she feels nauseated.   Pt reports she felt dizzy earlier.  Dizziness has resolved. Past Medical History:  Diagnosis Date  . Hypertension     There are no active problems to display for this patient.   Past Surgical History:  Procedure Laterality Date  . TUBAL LIGATION    . TUBAL LIGATION      OB History    Gravida Para Term Preterm AB Living   1         1   SAB TAB Ectopic Multiple Live Births                   Home Medications    Prior to Admission medications   Medication Sig Start Date End Date Taking? Authorizing Provider  amLODipine (NORVASC) 5 MG tablet Take 1 tablet by mouth daily. 05/20/15  Yes Historical Provider, MD  aspirin 325 MG tablet Take 325 mg by mouth daily as needed for mild pain or headache.   Yes Historical Provider, MD  bismuth subsalicylate (PEPTO BISMOL) 262 MG/15ML suspension Take 30 mLs by mouth every 6 (six) hours as needed (abdominal pain).   Yes Historical Provider, MD  metoprolol (LOPRESSOR) 100 MG tablet Take 1 tablet by mouth 2 (two) times daily. 04/09/15  Yes Historical Provider, MD  triamterene-hydrochlorothiazide (MAXZIDE-25) 37.5-25 MG tablet Take 1 tablet by mouth daily. 04/19/15  Yes Historical Provider, MD    Family History History reviewed. No pertinent family history.  Social  History Social History  Substance Use Topics  . Smoking status: Never Smoker  . Smokeless tobacco: Never Used  . Alcohol use No     Allergies   Lisinopril   Review of Systems Review of Systems  Neurological: Positive for headaches.  All other systems reviewed and are negative.    Physical Exam Updated Vital Signs BP 139/78   Pulse 79   Temp 98.7 F (37.1 C) (Oral)   Resp 18   Ht 5\' 7"  (1.702 m)   Wt 123.8 kg   LMP 05/06/2016   SpO2 96%   BMI 42.76 kg/m   Physical Exam  Constitutional: She is oriented to person, place, and time. She appears well-developed and well-nourished.  HENT:  Head: Normocephalic.  Eyes: EOM are normal.  Neck: Normal range of motion.  Cardiovascular: Normal rate and regular rhythm.   Pulmonary/Chest: Effort normal and breath sounds normal.  Abdominal: Soft. She exhibits no distension.  Musculoskeletal: Normal range of motion.  Neurological: She is alert and oriented to person, place, and time.  Psychiatric: She has a normal mood and affect.  Nursing note and vitals reviewed.    ED Treatments / Results  Labs (all labs ordered are listed, but only abnormal results are displayed) Labs Reviewed  CBC WITH DIFFERENTIAL/PLATELET -  Abnormal; Notable for the following:       Result Value   RBC 5.20 (*)    All other components within normal limits  COMPREHENSIVE METABOLIC PANEL - Abnormal; Notable for the following:    Glucose, Bld 104 (*)    Creatinine, Ser 1.09 (*)    Total Protein 9.1 (*)    GFR calc non Af Amer 59 (*)    All other components within normal limits  URINALYSIS, ROUTINE W REFLEX MICROSCOPIC (NOT AT [facility]) - Abnormal; Notable for the following:    APPearance HAZY (*)    Hgb urine dipstick TRACE (*)    Protein, ur 30 (*)    All other components within normal limits  URINE MICROSCOPIC-ADD ON - Abnormal; Notable for the following:    Squamous Epithelial / LPF 6-30 (*)    Bacteria, UA MANY (*)    All other components  within normal limits  PREGNANCY, URINE    EKG  EKG Interpretation None       Radiology No results found.  Procedures Procedures (including critical care time)  Medications Ordered in ED Medications  ondansetron (ZOFRAN-ODT) disintegrating tablet 4 mg (4 mg Oral Given 05/27/16 1102)     Initial Impression / Assessment and Plan / ED Course  I have reviewed the triage vital signs and the nursing notes.  Pertinent labs & imaging results that were available during my care of the patient were reviewed by me and considered in my medical decision making (see chart for details).  Clinical Course     Pt given zofran for nausea.   Labs reviewed urine shows many bacteria.  Protein in urine.  Creatine is slightly elevated but unchanged since 1 year ago.   I encouraged pt to drink plenty of fluids.  RX for keflex.  See Dr. Darleene Cleaver for recheck in 2-3 days.    Final diagnoses:  None    New Prescriptions New Prescriptions   No medications on file      Fransico Meadow, PA-C 05/27/16 [place], MD 05/27/16 [phone]

## 2016-05-27 NOTE — ED Triage Notes (Signed)
PT c/o waking up this morning with dizziness upon movement, headache and nausea with no fever. EMS CBG 126. PT states nausea has resolved at this time. PT denies any weakness in extremities or dizziness at this time.

## 2016-06-16 ENCOUNTER — Encounter (HOSPITAL_COMMUNITY): Payer: Self-pay | Admitting: Emergency Medicine

## 2016-06-16 ENCOUNTER — Emergency Department (HOSPITAL_COMMUNITY): Payer: Self-pay

## 2016-06-16 ENCOUNTER — Emergency Department (HOSPITAL_COMMUNITY)
Admission: EM | Admit: 2016-06-16 | Discharge: 2016-06-16 | Disposition: A | Discharge status: Home / Self Care | Payer: Self-pay | Attending: Emergency Medicine | Admitting: Emergency Medicine

## 2016-06-16 DIAGNOSIS — Z79899 Other long term (current) drug therapy: Secondary | ICD-10-CM | POA: Insufficient documentation

## 2016-06-16 DIAGNOSIS — R109 Unspecified abdominal pain: Secondary | ICD-10-CM | POA: Insufficient documentation

## 2016-06-16 DIAGNOSIS — R079 Chest pain, unspecified: Secondary | ICD-10-CM

## 2016-06-16 DIAGNOSIS — I10 Essential (primary) hypertension: Secondary | ICD-10-CM | POA: Insufficient documentation

## 2016-06-16 DIAGNOSIS — F419 Anxiety disorder, unspecified: Secondary | ICD-10-CM | POA: Insufficient documentation

## 2016-06-16 LAB — CBC WITH DIFFERENTIAL/PLATELET
BASOS ABS: 0 10*3/uL (ref 0.0–0.1)
Basophils Relative: 1 %
EOS PCT: 0 %
Eosinophils Absolute: 0 10*3/uL (ref 0.0–0.7)
HEMATOCRIT: 40.7 % (ref 36.0–46.0)
Hemoglobin: 13.5 g/dL (ref 12.0–15.0)
Lymphocytes Relative: 18 %
Lymphs Abs: 1.4 10*3/uL (ref 0.7–4.0)
MCH: 26.8 pg (ref 26.0–34.0)
MCHC: 33.2 g/dL (ref 30.0–36.0)
MCV: 80.8 fL (ref 78.0–100.0)
MONO ABS: 0.5 10*3/uL (ref 0.1–1.0)
MONOS PCT: 6 %
NEUTROS PCT: 75 %
Neutro Abs: 5.6 10*3/uL (ref 1.7–7.7)
PLATELETS: 310 10*3/uL (ref 150–400)
RBC: 5.04 MIL/uL (ref 3.87–5.11)
RDW: 15.1 % (ref 11.5–15.5)
WBC: 7.5 10*3/uL (ref 4.0–10.5)

## 2016-06-16 LAB — BASIC METABOLIC PANEL
ANION GAP: 8 (ref 5–15)
BUN: 16 mg/dL (ref 6–20)
CO2: 25 mmol/L (ref 22–32)
Calcium: 9.7 mg/dL (ref 8.9–10.3)
Chloride: 104 mmol/L (ref 101–111)
Creatinine, Ser: 1.04 mg/dL — ABNORMAL HIGH (ref 0.44–1.00)
GFR calc Af Amer: >60 mL/min (ref >60)
GLUCOSE: 104 mg/dL — AB (ref 65–99)
POTASSIUM: 3.1 mmol/L — AB (ref 3.5–5.1)
Sodium: 137 mmol/L (ref 135–145)

## 2016-06-16 LAB — TROPONIN I: Troponin I: <0.03 ng/mL (ref <0.03)

## 2016-06-16 MED ORDER — LORAZEPAM 1 MG PO TABS
1.0000 mg | ORAL_TABLET | Freq: Once | ORAL | Status: AC
  Administered 2016-06-16: 1 mg via ORAL
  Filled 2016-06-16: qty 1

## 2016-06-16 MED ORDER — LORAZEPAM 1 MG PO TABS: 1.0000 mg | ORAL_TABLET | Freq: Three times a day (TID) | ORAL | 0 refills | Status: DC | PRN

## 2016-06-16 MED ORDER — ASPIRIN 81 MG PO CHEW: 81.0000 mg | CHEWABLE_TABLET | Freq: Every day | ORAL | 3 refills | Status: DC

## 2016-06-16 NOTE — ED Triage Notes (Signed)
PT brought in by EMS and she was sitting outside of [employer] about to start ringing the [employer] bell and stated she started having sharp chest pain to left chest with SOB. PT was given 324 asprin by EMS and denies any chest pain or SOB at this time. PT also states increased anxiety and stress recently.

## 2016-06-16 NOTE — ED Notes (Addendum)
PT started breathing heavily again at this time while being examined for an IV and was having some chest tightness. PT was able to calm herself down with therapeutic relaxation techniques. 2 attempts of IV unseccessful by paramedic students at this time.

## 2016-06-16 NOTE — Discharge Instructions (Signed)
Today's workup for the chest pain without any acute findings. Feel that it was somewhat anxiety related. Start taking a baby aspirin a day. Take the short course of Ativan as needed. Make an appointment to follow-up with your doctor. Return for any recurrent chest pain lasting 15 or 20 minutes or longer.

## 2016-06-16 NOTE — ED Provider Notes (Addendum)
[facility] DEPT Provider Note   CSN: [id number] Arrival date & time: 06/16/16  1006  By signing my name below, I, Laurie Brown, attest that this documentation has been prepared under the direction and in the presence of Fredia Sorrow, MD. Electronically Signed: Rayna Brown, ED Scribe. 06/16/16. 11:19 AM.   History   Chief Complaint Chief Complaint  Patient presents with  . Chest Pain    HPI HPI Comments: Laurie Brown is a 50 y.o. female with a h/o HTN who presents to the Emergency Department by ambulance complaining of an episode of substernal, 10/10, CP which began around 9:40 AM. Pt reports a radiation of her CP to her left arm as well as associated epigastric pain. Per EMS, pt was given 324 mg aspirin en route. Upon arrival to the ED pt states that her CP had relieved. She denies a h/o CP or panic attacks but states that she has experienced increased stress recently. She is not on anticoagulation. Pt denies SOB, nausea, vomiting or other associated symptoms at this time.   The history is provided by the patient and medical records. No language interpreter was used.  Chest Pain   This is a new problem. The current episode started 1 to 2 hours ago. The problem occurs rarely. The problem has been resolved. The pain is present in the substernal region. The pain radiates to the epigastrium and left arm. Associated symptoms include abdominal pain. Pertinent negatives include no back pain, no cough, no fever, no headaches, no nausea, no shortness of breath and no vomiting.   Past Medical History:  Diagnosis Date  . Hypertension     There are no active problems to display for this patient.   Past Surgical History:  Procedure Laterality Date  . TUBAL LIGATION    . TUBAL LIGATION      OB History    Gravida Para Term Preterm AB Living   1         1   SAB TAB Ectopic Multiple Live Births                   Home Medications    Prior to Admission medications     Medication Sig Start Date End Date Taking? Authorizing Provider  amLODipine (NORVASC) 5 MG tablet Take 5 mg by mouth daily.    Yes Historical Provider, MD  aspirin 325 MG tablet Take 325 mg by mouth every 6 (six) hours as needed for mild pain, moderate pain or headache.    Yes Historical Provider, MD  metoprolol (LOPRESSOR) 100 MG tablet Take 100 mg by mouth 2 (two) times daily.    Yes Historical Provider, MD  triamterene-hydrochlorothiazide (MAXZIDE-25) 37.5-25 MG tablet Take 1 tablet by mouth daily.   Yes Historical Provider, MD  aspirin 81 MG chewable tablet Chew 1 tablet (81 mg total) by mouth daily. 06/16/16   Fredia Sorrow, MD  LORazepam (ATIVAN) 1 MG tablet Take 1 tablet (1 mg total) by mouth 3 (three) times daily as needed for anxiety. 06/16/16   Fredia Sorrow, MD    Family History History reviewed. No pertinent family history.  Social History Social History  Substance Use Topics  . Smoking status: Never Smoker  . Smokeless tobacco: Never Used  . Alcohol use No     Allergies   Lisinopril   Review of Systems Review of Systems  Constitutional: Negative for chills and fever.  HENT: Negative for congestion, postnasal drip, rhinorrhea, sinus pressure and sore throat.  Eyes: Negative for visual disturbance.  Respiratory: Negative for cough and shortness of breath.   Cardiovascular: Positive for chest pain. Negative for leg swelling.  Gastrointestinal: Positive for abdominal pain. Negative for diarrhea, nausea and vomiting.  Genitourinary: Negative for dysuria and hematuria.  Musculoskeletal: Negative for back pain.  Skin: Negative for rash.  Neurological: Negative for syncope, light-headedness and headaches.  Hematological: Does not bruise/bleed easily.  Psychiatric/Behavioral: Negative for confusion.  All other systems reviewed and are negative.  Physical Exam Updated Vital Signs BP 117/90   Pulse 97   Temp 98.1 F (36.7 C) (Oral)   Resp 19   Ht 5\' 7"  (1.702 m)    Wt 122.5 kg   LMP 05/06/2016   SpO2 98%   BMI 42.29 kg/m   Physical Exam  Constitutional: She is oriented to person, place, and time. She appears well-developed and well-nourished. No distress.  HENT:  Head: Normocephalic and atraumatic.  Mouth/Throat: Oropharynx is clear and moist and mucous membranes are normal.  Eyes: Conjunctivae and EOM are normal. Pupils are equal, round, and reactive to light.  Sclera injected bilaterally  Neck: Normal range of motion. Neck supple. No tracheal deviation present.  Cardiovascular: Regular rhythm, normal heart sounds and intact distal pulses.  Tachycardia present.   Pulmonary/Chest: Breath sounds normal. No respiratory distress. She has no wheezes. She has no rales. She exhibits no tenderness.  100% on RA during examination  Abdominal: Soft. Bowel sounds are normal. There is no tenderness.  Musculoskeletal: Normal range of motion.  No swelling noted in the bilateral ankles  Neurological: She is alert and oriented to person, place, and time. No cranial nerve deficit. She exhibits normal muscle tone. Coordination normal.  Moves fingers and toes normally  Skin: Skin is warm and dry.  Psychiatric: She has a normal mood and affect. Her behavior is normal.  Nursing note and vitals reviewed.  ED Treatments / Results  Labs (all labs ordered are listed, but only abnormal results are displayed) Labs Reviewed  BASIC METABOLIC PANEL - Abnormal; Notable for the following:       Result Value   Potassium 3.1 (*)    Glucose, Bld 104 (*)    Creatinine, Ser 1.04 (*)    All other components within normal limits  CBC WITH DIFFERENTIAL/PLATELET  TROPONIN I    EKG  EKG Interpretation  Date/Time:  Friday June 16 2016 10:14:14 EST Ventricular Rate:  141 PR Interval:    QRS Duration: 92 QT Interval:  300 QTC Calculation: 460 R Axis:   -74 Text Interpretation:  Sinus tachycardia Left anterior fascicular block Abnormal R-wave progression, late  transition Confirmed by Alixandra Alfieri  MD, Jaysten Essner [id number]) on 06/16/2016 10:44:37 AM       Radiology Dg Chest 2 View  Result Date: 06/16/2016 CLINICAL DATA:  Chest pain, shortness of breath. EXAM: CHEST  2 VIEW COMPARISON:  Radiographs of Dec 03, 2015. FINDINGS: The heart size and mediastinal contours are within normal limits. Both lungs are clear. No pneumothorax or pleural effusion is noted. The visualized skeletal structures are unremarkable. IMPRESSION: No active cardiopulmonary disease. Electronically Signed   By: Marijo Conception, M.D.   On: 06/16/2016 11:56    Procedures Procedures  DIAGNOSTIC STUDIES: Oxygen Saturation is 100% on RA, normal by my interpretation.    COORDINATION OF CARE: 11:07 AM Discussed next steps with pt. Pt verbalized understanding and is agreeable with the plan.    Medications Ordered in ED Medications  LORazepam (ATIVAN) tablet 1 mg (  1 mg Oral Given 06/16/16 1135)     Initial Impression / Assessment and Plan / ED Course  I have reviewed the triage vital signs and the nursing notes.  Pertinent labs & imaging results that were available during my care of the patient were reviewed by me and considered in my medical decision making (see chart for details).  Clinical Course    Patient was at work has been feeling very stressed out lately. Reck in [place] called for chest tightness started at 940 lasted about 15 minutes. Associated with some mushy shortness of breath and some anxiety and a funny feeling in her chest. Patient was able to calm herself down with some relaxation techniques. Symptoms resolved and have not reoccurred. EMS did give her 324 mg of aspirin. Patient was not given any nitroglycerin. Patient was pain and symptomatic free upon arrival.  Workup without any acute findings. EKG showed a sinus tachycardia with a ventricular rate around 141. Repeat heart rate is now in the 90s. No hypoxia and no further shortness of breath no concerns for pulmonary  embolus. Patient was given Ativan here which resulted in marked improvement in her anxiety as well as her heart rate. Patient's labs without any significant abnormalities. Troponin was negative. Potassium slightly low at 3.1 mg. No other electrolyte abnormalities. Patient given some oral potassium here. Renal function was normal. Patient has follow-up available. Patient return for any new or worse symptoms. Work note provided.  Patient symptoms seem to be more consistent with anxiety and hyperventilation than acute cardiac chest pain.  I personally performed the services described in this documentation, which was scribed in my presence. The recorded information has been reviewed and is accurate.    Final Clinical Impressions(s) / ED Diagnoses   Final diagnoses:  Chest pain, unspecified type  Anxiety    New Prescriptions New Prescriptions   ASPIRIN 81 MG CHEWABLE TABLET    Chew 1 tablet (81 mg total) by mouth daily.   LORAZEPAM (ATIVAN) 1 MG TABLET    Take 1 tablet (1 mg total) by mouth 3 (three) times daily as needed for anxiety.     Fredia Sorrow, MD 06/16/16 1347    Fredia Sorrow, MD 06/16/16 1348

## 2016-06-20 ENCOUNTER — Emergency Department (HOSPITAL_COMMUNITY): Payer: Self-pay

## 2016-06-20 ENCOUNTER — Encounter (HOSPITAL_COMMUNITY): Payer: Self-pay | Admitting: Emergency Medicine

## 2016-06-20 ENCOUNTER — Emergency Department (HOSPITAL_COMMUNITY)
Admission: EM | Admit: 2016-06-20 | Discharge: 2016-06-20 | Disposition: A | Discharge status: Home / Self Care | Payer: Self-pay | Attending: Emergency Medicine | Admitting: Emergency Medicine

## 2016-06-20 DIAGNOSIS — R0789 Other chest pain: Secondary | ICD-10-CM | POA: Insufficient documentation

## 2016-06-20 DIAGNOSIS — Z7982 Long term (current) use of aspirin: Secondary | ICD-10-CM | POA: Insufficient documentation

## 2016-06-20 DIAGNOSIS — I10 Essential (primary) hypertension: Secondary | ICD-10-CM | POA: Insufficient documentation

## 2016-06-20 DIAGNOSIS — R112 Nausea with vomiting, unspecified: Secondary | ICD-10-CM | POA: Insufficient documentation

## 2016-06-20 DIAGNOSIS — R0602 Shortness of breath: Secondary | ICD-10-CM | POA: Insufficient documentation

## 2016-06-20 DIAGNOSIS — Z79899 Other long term (current) drug therapy: Secondary | ICD-10-CM | POA: Insufficient documentation

## 2016-06-20 LAB — CBC
HEMATOCRIT: 41.8 % (ref 36.0–46.0)
HEMOGLOBIN: 14 g/dL (ref 12.0–15.0)
MCH: 26.6 pg (ref 26.0–34.0)
MCHC: 33.5 g/dL (ref 30.0–36.0)
MCV: 79.5 fL (ref 78.0–100.0)
Platelets: 356 10*3/uL (ref 150–400)
RBC: 5.26 MIL/uL — ABNORMAL HIGH (ref 3.87–5.11)
RDW: 14.8 % (ref 11.5–15.5)
WBC: 8.3 10*3/uL (ref 4.0–10.5)

## 2016-06-20 LAB — COMPREHENSIVE METABOLIC PANEL
ALBUMIN: 4.1 g/dL (ref 3.5–5.0)
ALT: 36 U/L (ref 14–54)
AST: 27 U/L (ref 15–41)
Alkaline Phosphatase: 66 U/L (ref 38–126)
Anion gap: 10 (ref 5–15)
BUN: 14 mg/dL (ref 6–20)
CHLORIDE: 102 mmol/L (ref 101–111)
CO2: 25 mmol/L (ref 22–32)
Calcium: 10.4 mg/dL — ABNORMAL HIGH (ref 8.9–10.3)
Creatinine, Ser: 1.03 mg/dL — ABNORMAL HIGH (ref 0.44–1.00)
GFR calc Af Amer: >60 mL/min (ref >60)
Glucose, Bld: 99 mg/dL (ref 65–99)
POTASSIUM: 3.1 mmol/L — AB (ref 3.5–5.1)
SODIUM: 137 mmol/L (ref 135–145)
Total Bilirubin: 0.5 mg/dL (ref 0.3–1.2)
Total Protein: 9.2 g/dL — ABNORMAL HIGH (ref 6.5–8.1)

## 2016-06-20 LAB — D-DIMER, QUANTITATIVE (NOT AT ARMC)

## 2016-06-20 LAB — TROPONIN I

## 2016-06-20 LAB — LIPASE, BLOOD: Lipase: 22 U/L (ref 11–51)

## 2016-06-20 MED ORDER — ASPIRIN 81 MG PO CHEW: 324.0000 mg | CHEWABLE_TABLET | Freq: Once | ORAL | Status: DC

## 2016-06-20 MED ORDER — POTASSIUM CHLORIDE CRYS ER 20 MEQ PO TBCR
40.0000 meq | EXTENDED_RELEASE_TABLET | Freq: Once | ORAL | Status: AC
  Administered 2016-06-20: 40 meq via ORAL
  Filled 2016-06-20: qty 2

## 2016-06-20 MED ORDER — PROMETHAZINE HCL 25 MG PO TABS: 25.0000 mg | ORAL_TABLET | Freq: Four times a day (QID) | ORAL | 0 refills | Status: DC | PRN

## 2016-06-20 MED ORDER — ONDANSETRON HCL 4 MG/2ML IJ SOLN
4.0000 mg | Freq: Once | INTRAMUSCULAR | Status: AC
  Administered 2016-06-20: 4 mg via INTRAVENOUS
  Filled 2016-06-20: qty 2

## 2016-06-20 NOTE — ED Notes (Signed)
Patient walked to the bathroom with minimal assistance.  

## 2016-06-20 NOTE — Discharge Instructions (Signed)
Your lab tests, ekg and xrays are negative today for the source of your symptoms.  I recommend taking the medicine prescribed at your last visit to help rule out anxiety as being the source of your symptoms.  In the meantime, call your Dr. for an office follow-up for recheck of your symptoms and further management if they persist.  As discussed, you may benefit from a cardiac stress passed to help further ensure heart health.  Discussed this with your primary doctor who can arrange this if she feels it is appropriate.

## 2016-06-20 NOTE — ED Triage Notes (Signed)
Pt c/o SOB today upon waking. Reports 1 episode n/v. Pt seen in ED for same on 12/1 and given prescription for ativan po-states she has not taken it.

## 2016-06-23 NOTE — ED Provider Notes (Signed)
[facility] DEPT Provider Note   CSN: [id number] Arrival date & time: 06/20/16  0751     History   Chief Complaint Chief Complaint  Patient presents with  . Chest Pain    HPI Laurie Brown is a 50 y.o. female with a history of htn, returns today for a recheck of her chest pain and shortness of breath.  She was seen here 4 days ago for similar symptoms at which time her workup was negative and it was felt her symptoms may be stress related.  She was prescribed ativan but has not tried this medication.  Today, she woke with the same mid sternal chest pressure but without radiation and shortness of breath along with one episode of emesis.  She denies persistent nausea or vomiting and denies history of acid reflux.  She does endorse increased work stressors recently.  She has had no treatment prior to arrival. She denies any family history of significant cardiac concerns.  Risk factors for PE include obesity only. She denies extremity swelling or pain.   The history is provided by the patient.    Past Medical History:  Diagnosis Date  . Hypertension     There are no active problems to display for this patient.   Past Surgical History:  Procedure Laterality Date  . TUBAL LIGATION    . TUBAL LIGATION      OB History    Gravida Para Term Preterm AB Living   1         1   SAB TAB Ectopic Multiple Live Births                   Home Medications    Prior to Admission medications   Medication Sig Start Date End Date Taking? Authorizing Provider  amLODipine (NORVASC) 5 MG tablet Take 5 mg by mouth daily.    Yes Historical Provider, MD  aspirin 81 MG chewable tablet Chew 1 tablet (81 mg total) by mouth daily. 06/16/16  Yes Fredia Sorrow, MD  metoprolol (LOPRESSOR) 100 MG tablet Take 100 mg by mouth 2 (two) times daily.    Yes Historical Provider, MD  triamterene-hydrochlorothiazide (MAXZIDE-25) 37.5-25 MG tablet Take 1 tablet by mouth daily.   Yes Historical Provider, MD    LORazepam (ATIVAN) 1 MG tablet Take 1 tablet (1 mg total) by mouth 3 (three) times daily as needed for anxiety. 06/16/16   Fredia Sorrow, MD  promethazine (PHENERGAN) 25 MG tablet Take 1 tablet (25 mg total) by mouth every 6 (six) hours as needed for nausea or vomiting. 06/20/16   Evalee Jefferson, PA-C    Family History No family history on file.  Social History Social History  Substance Use Topics  . Smoking status: Never Smoker  . Smokeless tobacco: Never Used  . Alcohol use No     Allergies   Lisinopril   Review of Systems Review of Systems  Constitutional: Negative for fever.  HENT: Negative for congestion and sore throat.   Eyes: Negative.   Respiratory: Positive for shortness of breath. Negative for chest tightness, wheezing and stridor.   Cardiovascular: Positive for chest pain. Negative for palpitations and leg swelling.  Gastrointestinal: Positive for nausea and vomiting. Negative for abdominal pain.  Genitourinary: Negative.   Musculoskeletal: Negative for arthralgias, joint swelling and neck pain.  Skin: Negative.  Negative for rash and wound.  Neurological: Negative for dizziness, weakness, light-headedness, numbness and headaches.  Psychiatric/Behavioral: Negative.   All other systems reviewed and are negative.  Physical Exam Updated Vital Signs BP 140/99 (BP Location: Left Arm)   Pulse 100   Temp 97.9 F (36.6 C) (Oral)   Resp 18   Ht 5\' 7"  (1.702 m)   Wt 116.1 kg   LMP 05/06/2016   SpO2 100%   BMI 40.10 kg/m   Physical Exam  Constitutional: She appears well-developed and well-nourished.  HENT:  Head: Normocephalic and atraumatic.  Eyes: Conjunctivae are normal.  Neck: Normal range of motion.  Cardiovascular: Normal rate, regular rhythm, normal heart sounds and intact distal pulses.   Pulses:      Radial pulses are 2+ on the right side, and 2+ on the left side.       Dorsalis pedis pulses are 2+ on the right side, and 2+ on the left side.   Pulmonary/Chest: Effort normal and breath sounds normal. She has no wheezes.  Abdominal: Soft. Bowel sounds are normal. There is no tenderness.  Musculoskeletal: Normal range of motion. She exhibits no edema.  Neurological: She is alert.  Skin: Skin is warm and dry.  Psychiatric: She has a normal mood and affect.  Nursing note and vitals reviewed.    ED Treatments / Results  Labs (all labs ordered are listed, but only abnormal results are displayed) Labs Reviewed  COMPREHENSIVE METABOLIC PANEL - Abnormal; Notable for the following:       Result Value   Potassium 3.1 (*)    Creatinine, Ser 1.03 (*)    Calcium 10.4 (*)    Total Protein 9.2 (*)    All other components within normal limits  CBC - Abnormal; Notable for the following:    RBC 5.26 (*)    All other components within normal limits  TROPONIN I  LIPASE, BLOOD  D-DIMER, QUANTITATIVE (NOT AT [facility])    EKG  EKG Interpretation  Date/Time:  Tuesday June 20 2016 08:01:22 EST Ventricular Rate:  83 PR Interval:    QRS Duration: 84 QT Interval:  387 QTC Calculation: 455 R Axis:   -48 Text Interpretation:  Sinus rhythm Left anterior fascicular block Abnormal R-wave progression, late transition Confirmed by Christy Gentles  MD, DONALD ([id number]) on 06/21/2016 1:08:59 PM       Radiology No results found.  Procedures Procedures (including critical care time)  Medications Ordered in ED Medications  ondansetron (ZOFRAN) injection 4 mg (4 mg Intravenous Given 06/20/16 0902)  potassium chloride SA (K-DUR,KLOR-CON) CR tablet 40 mEq (40 mEq Oral Given 06/20/16 1107)     Initial Impression / Assessment and Plan / ED Course  I have reviewed the triage vital signs and the nursing notes.  Pertinent labs & imaging results that were available during my care of the patient were reviewed by me and considered in my medical decision making (see chart for details).  Clinical Course     Labs, imaging, ekg reviewed and discussed with  Dr. Roderic Palau.  D dimer negative.  No evidence for ACS, doubt PE.  Favor anxiety as source of sx.  Pt advised f/u with pcp for a recheck this week if sx persist. Encouraged to try the ativan prescribed at her last visit.  Final Clinical Impressions(s) / ED Diagnoses   Final diagnoses:  Atypical chest pain    New Prescriptions Discharge Medication List as of 06/20/2016 12:41 PM    START taking these medications   Details  promethazine (PHENERGAN) 25 MG tablet Take 1 tablet (25 mg total) by mouth every 6 (six) hours as needed for nausea or vomiting., Starting Tue  06/20/2016, Print         Evalee Jefferson, PA-C 06/23/16 1317    Milton Ferguson, MD 06/23/16 [phone]

## 2017-05-12 IMAGING — CR DG CHEST 1V PORT
1 series · 1 of 1 positions shown · non-contrast
Comparison: 06/16/2016

CLINICAL DATA: Central chest pressure for several days

EXAM:
PORTABLE CHEST 1 VIEW

[portable]
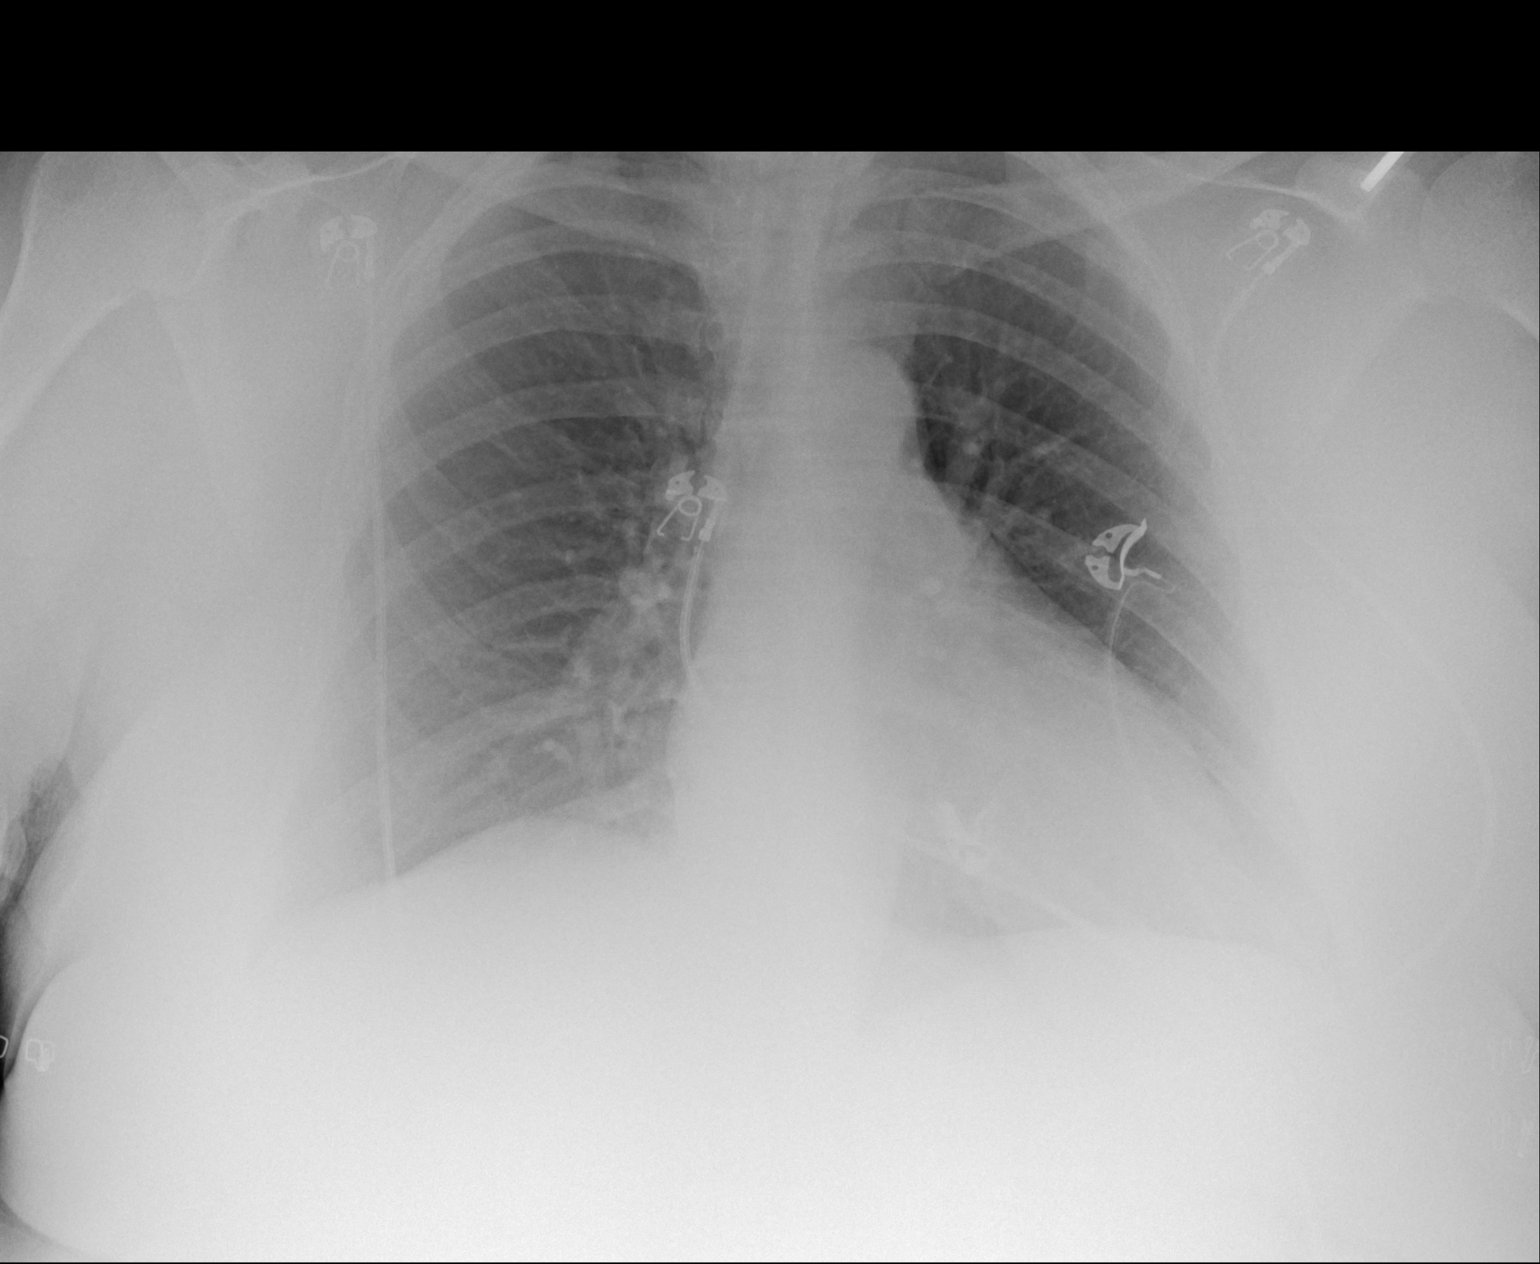

[1 of 1 positions shown; findings below may reference images not displayed]

FINDINGS: Cardiac shadow is stable. The lungs are well aerated bilaterally. No
focal infiltrate or sizable effusion is seen. No bony abnormality is
noted.
IMPRESSION: No active disease.

## 2018-05-08 ENCOUNTER — Other Ambulatory Visit: Payer: Self-pay

## 2018-05-08 ENCOUNTER — Encounter (HOSPITAL_COMMUNITY): Payer: Self-pay | Admitting: Emergency Medicine

## 2018-05-08 ENCOUNTER — Emergency Department (HOSPITAL_COMMUNITY): Payer: Self-pay

## 2018-05-08 ENCOUNTER — Emergency Department (HOSPITAL_COMMUNITY)
Admission: EM | Admit: 2018-05-08 | Discharge: 2018-05-08 | Disposition: A | Discharge status: Home / Self Care | Payer: Self-pay | Attending: Emergency Medicine | Admitting: Emergency Medicine

## 2018-05-08 DIAGNOSIS — Z79899 Other long term (current) drug therapy: Secondary | ICD-10-CM | POA: Insufficient documentation

## 2018-05-08 DIAGNOSIS — E119 Type 2 diabetes mellitus without complications: Secondary | ICD-10-CM | POA: Insufficient documentation

## 2018-05-08 DIAGNOSIS — Z7982 Long term (current) use of aspirin: Secondary | ICD-10-CM | POA: Insufficient documentation

## 2018-05-08 DIAGNOSIS — I10 Essential (primary) hypertension: Secondary | ICD-10-CM | POA: Insufficient documentation

## 2018-05-08 DIAGNOSIS — M25511 Pain in right shoulder: Secondary | ICD-10-CM | POA: Insufficient documentation

## 2018-05-08 HISTORY — DX: Type 2 diabetes mellitus without complications: E11.9

## 2018-05-08 MED ORDER — IBUPROFEN 600 MG PO TABS: 600.0000 mg | ORAL_TABLET | Freq: Four times a day (QID) | ORAL | 0 refills | Status: DC | PRN

## 2018-05-08 NOTE — ED Provider Notes (Signed)
[facility] EMERGENCY DEPARTMENT Provider Note   CSN: [id number] Arrival date & time: 05/08/18  1131     History   Chief Complaint Chief Complaint  Patient presents with  . Shoulder Pain    HPI Laurie Brown is a 52 y.o. female.  HPI   Laurie Brown is a 52 y.o. female who presents to the Emergency Department complaining of right shoulder pain for one month.  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  This repetitive movement makes her should pain worse.  She has tried Tylenol and muscle rub cream without relief.  No known injury, arm pain, neck pain, headache,  numbness or weakness of the extremities.     Past Medical History:  Diagnosis Date  . Diabetes mellitus without complication ([place])   . Hypertension     There are no active problems to display for this patient.   Past Surgical History:  Procedure Laterality Date  . TUBAL LIGATION    . TUBAL LIGATION       OB History    Gravida  1   Para      Term      Preterm      AB      Living  1     SAB      TAB      Ectopic      Multiple      Live Births               Home Medications    Prior to Admission medications   Medication Sig Start Date End Date Taking? Authorizing Provider  amLODipine (NORVASC) 5 MG tablet Take 5 mg by mouth daily.     [provider]  aspirin 81 MG chewable tablet Chew 1 tablet (81 mg total) by mouth daily. 06/16/16   Fredia Sorrow, MD  ibuprofen (ADVIL,MOTRIN) 600 MG tablet Take 1 tablet (600 mg total) by mouth every 6 (six) hours as needed. Take with food 05/08/18   Massa Pe, PA-C  LORazepam (ATIVAN) 1 MG tablet Take 1 tablet (1 mg total) by mouth 3 (three) times daily as needed for anxiety. 06/16/16   Fredia Sorrow, MD  metoprolol (LOPRESSOR) 100 MG tablet Take 100 mg by mouth 2 (two) times daily.     [provider]  promethazine (PHENERGAN) 25 MG tablet Take 1  tablet (25 mg total) by mouth every 6 (six) hours as needed for nausea or vomiting. 06/20/16   Evalee Jefferson, PA-C  triamterene-hydrochlorothiazide (MAXZIDE-25) 37.5-25 MG tablet Take 1 tablet by mouth daily.    [provider]    Family History History reviewed. No pertinent family history.  Social History Social History   Tobacco Use  . Smoking status: Never Smoker  . Smokeless tobacco: Never Used  Substance Use Topics  . Alcohol use: No  . Drug use: No     Allergies   Lisinopril   Review of Systems Review of Systems  Constitutional: Negative for chills and fever.  Respiratory: Negative for shortness of breath.   Cardiovascular: Negative for chest pain.  Musculoskeletal: Positive for arthralgias (right shoulder pain). Negative for back pain, joint swelling and neck pain.  Skin: Negative for color change and wound.  Neurological: Negative for dizziness, weakness, numbness and headaches.     Physical Exam Updated Vital Signs BP 133/75   Pulse 71   Temp 98.1 F (36.7 C) (Oral)  Resp 20   Ht 5\' 7"  (1.702 m)   Wt 117.9 kg   LMP 05/06/2016   SpO2 97%   BMI 40.72 kg/m   Physical Exam  Constitutional: She appears well-developed and well-nourished. No distress.  HENT:  Head: Atraumatic.  Mouth/Throat: Oropharynx is clear and moist.  Neck: Normal range of motion. Neck supple.  Cardiovascular: Normal rate, regular rhythm and intact distal pulses.  Pulmonary/Chest: Effort normal and breath sounds normal. No respiratory distress.  Musculoskeletal: Normal range of motion. She exhibits tenderness. She exhibits no edema or deformity.       Right shoulder: She exhibits tenderness. She exhibits no bony tenderness, no crepitus, normal pulse and normal strength.  Pain with abduction of the right arm, pt has FROM.  No crepitus, excessive warmth.  No tenderness of the C spine or paraspinal muscles.    Neurological: She is alert. No sensory deficit.  Skin: Skin is warm.  Capillary refill takes less than 2 seconds. No rash noted. No erythema.  Psychiatric: She has a normal mood and affect.  Nursing note and vitals reviewed.    ED Treatments / Results  Labs (all labs ordered are listed, but only abnormal results are displayed) Labs Reviewed - No data to display  EKG None  Radiology Dg Shoulder Right  Result Date: 05/08/2018 CLINICAL DATA:  pain EXAM: RIGHT SHOULDER - 2+ VIEW COMPARISON:  None. FINDINGS: There is no evidence of fracture or dislocation. There is no evidence of arthropathy or other focal bone abnormality. Soft tissues are unremarkable. IMPRESSION: Negative. Electronically Signed   By: Lucrezia Europe M.D.   On: 05/08/2018 12:41    Procedures Procedures (including critical care time)  Medications Ordered in ED Medications - No data to display   Initial Impression / Assessment and Plan / ED Course  I have reviewed the triage vital signs and the nursing notes.  Pertinent labs & imaging results that were available during my care of the patient were reviewed by me and considered in my medical decision making (see chart for details).     Pt well appearing.  NV intact.  No concerning sx's for septic joint.  XR reassuring.  I feel her sx's are inflammatory and secondary to repetitive movements.  She agrees to tx plan and orthopedic f/u if needed.    Final Clinical Impressions(s) / ED Diagnoses   Final diagnoses:  Acute pain of right shoulder    ED Discharge Orders         Ordered    ibuprofen (ADVIL,MOTRIN) 600 MG tablet  Every 6 hours PRN     05/08/18 1319           Kem Parkinson, PA-C 05/08/18 1738    Virgel Manifold, MD 05/09/18 [phone]

## 2018-05-08 NOTE — Discharge Instructions (Addendum)
Apply ice packs on and off to your shoulder.  Take medication as directed.  Call Dr. Ruthe Mannan office in 1 week to arrange a follow-up appointment if not improving.

## 2018-05-08 NOTE — ED Triage Notes (Signed)
Pt states does a lot of lifting heavy things at work. Pt reports pain for one month.

## 2018-05-08 NOTE — ED Notes (Signed)
Right shoulder pain, worse with movement.

## 2019-04-15 ENCOUNTER — Other Ambulatory Visit: Payer: Self-pay

## 2019-04-15 DIAGNOSIS — Z20822 Contact with and (suspected) exposure to covid-19: Secondary | ICD-10-CM

## 2019-04-17 LAB — NOVEL CORONAVIRUS, NAA: SARS-CoV-2, NAA: NOT DETECTED

## 2020-01-02 ENCOUNTER — Encounter (HOSPITAL_COMMUNITY): Payer: Self-pay | Admitting: Emergency Medicine

## 2020-01-02 ENCOUNTER — Other Ambulatory Visit: Payer: Self-pay

## 2020-01-02 ENCOUNTER — Emergency Department (HOSPITAL_COMMUNITY)
Admission: EM | Admit: 2020-01-02 | Discharge: 2020-01-02 | Disposition: A | Discharge status: Home / Self Care | Payer: BC Managed Care – PPO | Attending: Emergency Medicine | Admitting: Emergency Medicine

## 2020-01-02 DIAGNOSIS — I10 Essential (primary) hypertension: Secondary | ICD-10-CM | POA: Diagnosis not present

## 2020-01-02 DIAGNOSIS — E119 Type 2 diabetes mellitus without complications: Secondary | ICD-10-CM | POA: Diagnosis not present

## 2020-01-02 DIAGNOSIS — Z7982 Long term (current) use of aspirin: Secondary | ICD-10-CM | POA: Diagnosis not present

## 2020-01-02 DIAGNOSIS — R197 Diarrhea, unspecified: Secondary | ICD-10-CM | POA: Diagnosis not present

## 2020-01-02 DIAGNOSIS — E876 Hypokalemia: Secondary | ICD-10-CM

## 2020-01-02 DIAGNOSIS — Z79899 Other long term (current) drug therapy: Secondary | ICD-10-CM | POA: Insufficient documentation

## 2020-01-02 DIAGNOSIS — R112 Nausea with vomiting, unspecified: Secondary | ICD-10-CM | POA: Diagnosis not present

## 2020-01-02 DIAGNOSIS — E669 Obesity, unspecified: Secondary | ICD-10-CM | POA: Diagnosis not present

## 2020-01-02 DIAGNOSIS — R1013 Epigastric pain: Secondary | ICD-10-CM | POA: Diagnosis not present

## 2020-01-02 DIAGNOSIS — E860 Dehydration: Secondary | ICD-10-CM | POA: Insufficient documentation

## 2020-01-02 DIAGNOSIS — Z888 Allergy status to other drugs, medicaments and biological substances status: Secondary | ICD-10-CM | POA: Insufficient documentation

## 2020-01-02 DIAGNOSIS — R109 Unspecified abdominal pain: Secondary | ICD-10-CM | POA: Diagnosis not present

## 2020-01-02 LAB — CBC
HCT: 39.3 % (ref 36.0–46.0)
Hemoglobin: 12.3 g/dL (ref 12.0–15.0)
MCH: 24.8 pg — ABNORMAL LOW (ref 26.0–34.0)
MCHC: 31.3 g/dL (ref 30.0–36.0)
MCV: 79.4 fL — ABNORMAL LOW (ref 80.0–100.0)
Platelets: 371 10*3/uL (ref 150–400)
RBC: 4.95 MIL/uL (ref 3.87–5.11)
RDW: 15.4 % (ref 11.5–15.5)
WBC: 11.3 10*3/uL — ABNORMAL HIGH (ref 4.0–10.5)
nRBC: 0 % (ref 0.0–0.2)

## 2020-01-02 LAB — BASIC METABOLIC PANEL
Anion gap: 9 (ref 5–15)
BUN: 14 mg/dL (ref 6–20)
CO2: 29 mmol/L (ref 22–32)
Calcium: 10.2 mg/dL (ref 8.9–10.3)
Chloride: 97 mmol/L — ABNORMAL LOW (ref 98–111)
Creatinine, Ser: 1.2 mg/dL — ABNORMAL HIGH (ref 0.44–1.00)
GFR calc Af Amer: >60 mL/min (ref >60)
GFR calc non Af Amer: 52 mL/min — ABNORMAL LOW (ref >60)
Glucose, Bld: 110 mg/dL — ABNORMAL HIGH (ref 70–99)
Potassium: 3 mmol/L — ABNORMAL LOW (ref 3.5–5.1)
Sodium: 135 mmol/L (ref 135–145)

## 2020-01-02 MED ORDER — ONDANSETRON HCL 4 MG/2ML IJ SOLN
4.0000 mg | Freq: Once | INTRAMUSCULAR | Status: AC
  Administered 2020-01-02: 4 mg via INTRAVENOUS
  Filled 2020-01-02: qty 2

## 2020-01-02 MED ORDER — SODIUM CHLORIDE 0.9 % IV BOLUS
1000.0000 mL | Freq: Once | INTRAVENOUS | Status: AC
  Administered 2020-01-02: 1000 mL via INTRAVENOUS

## 2020-01-02 MED ORDER — ONDANSETRON 4 MG PO TBDP
ORAL_TABLET | ORAL | 0 refills | Status: DC
Start: 2020-01-02 — End: 2021-03-04

## 2020-01-02 MED ORDER — POTASSIUM CHLORIDE CRYS ER 20 MEQ PO TBCR
40.0000 meq | EXTENDED_RELEASE_TABLET | Freq: Once | ORAL | Status: AC
  Administered 2020-01-02: 40 meq via ORAL
  Filled 2020-01-02: qty 2

## 2020-01-02 MED ORDER — MORPHINE SULFATE (PF) 4 MG/ML IV SOLN
4.0000 mg | Freq: Once | INTRAVENOUS | Status: AC
  Administered 2020-01-02: 4 mg via INTRAVENOUS
  Filled 2020-01-02: qty 1

## 2020-01-02 MED ORDER — POTASSIUM CHLORIDE CRYS ER 20 MEQ PO TBCR
20.0000 meq | EXTENDED_RELEASE_TABLET | Freq: Every day | ORAL | 0 refills | Status: DC
Start: 2020-01-02 — End: 2021-03-04

## 2020-01-02 NOTE — ED Provider Notes (Signed)
[facility] EMERGENCY DEPARTMENT Provider Note   CSN: [id number] Arrival date & time: 01/02/20  1943     History Chief Complaint  Patient presents with  . Abdominal Pain    nausea    Laurie Brown is a 54 y.o. female.  The history is provided by the patient. No language interpreter was used.  Abdominal Pain    53 year old female with history of diabetes, hypertension, presenting with complaints of abdominal pain.  For the past 3 days patient endorses intermittent pain across abdomen radiates towards the back.  Pain is described as a sharp cramping sensation, 6 out of 10, achy, with associated nausea, occasional nonbloody nonbilious vomits, as well as occasional loose stools.  She does not claim any fever chills no chest pain shortness of breath productive cough no loss of taste or smell no dysuria or hematuria.  She denies any recent travel or eating exotic food.  No recent sick exposure.  She denies any specific treatment tried at home.  Past Medical History:  Diagnosis Date  . Diabetes mellitus without complication ([place])   . Hypertension     There are no problems to display for this patient.   Past Surgical History:  Procedure Laterality Date  . TUBAL LIGATION    . TUBAL LIGATION       OB History    Gravida  1   Para      Term      Preterm      AB      Living  1     SAB      TAB      Ectopic      Multiple      Live Births              History reviewed. No pertinent family history.  Social History   Tobacco Use  . Smoking status: Never Smoker  . Smokeless tobacco: Never Used  Vaping Use  . Vaping Use: Never used  Substance Use Topics  . Alcohol use: No  . Drug use: No    Home Medications Prior to Admission medications   Medication Sig Start Date End Date Taking? Authorizing Provider  amLODipine (NORVASC) 5 MG tablet Take 5 mg by mouth daily.     [provider]  aspirin 81 MG chewable tablet Chew 1 tablet (81 mg total)  by mouth daily. 06/16/16   Fredia Sorrow, MD  ibuprofen (ADVIL,MOTRIN) 600 MG tablet Take 1 tablet (600 mg total) by mouth every 6 (six) hours as needed. Take with food 05/08/18   Triplett, Tammy, PA-C  LORazepam (ATIVAN) 1 MG tablet Take 1 tablet (1 mg total) by mouth 3 (three) times daily as needed for anxiety. 06/16/16   Fredia Sorrow, MD  metoprolol (LOPRESSOR) 100 MG tablet Take 100 mg by mouth 2 (two) times daily.     [provider]  promethazine (PHENERGAN) 25 MG tablet Take 1 tablet (25 mg total) by mouth every 6 (six) hours as needed for nausea or vomiting. 06/20/16   Evalee Jefferson, PA-C  triamterene-hydrochlorothiazide (MAXZIDE-25) 37.5-25 MG tablet Take 1 tablet by mouth daily.    [provider]    Allergies    Lisinopril  Review of Systems   Review of Systems  Gastrointestinal: Positive for abdominal pain.  All other systems reviewed and are negative.   Physical Exam Updated Vital Signs BP 133/85 (BP Location: Right Arm)   Pulse 81   Temp 98.9 F (37.2 C) (Oral)  Resp 18   Ht 5\' 7"  (1.702 m)   Wt 119.3 kg   LMP 05/06/2016   SpO2 97%   BMI 41.19 kg/m   Physical Exam Vitals and nursing note reviewed.  Constitutional:      General: She is not in acute distress.    Appearance: She is well-developed. She is obese.  HENT:     Head: Atraumatic.  Eyes:     Conjunctiva/sclera: Conjunctivae normal.  Cardiovascular:     Rate and Rhythm: Normal rate and regular rhythm.  Pulmonary:     Effort: Pulmonary effort is normal.     Breath sounds: Normal breath sounds.  Abdominal:     General: Abdomen is flat. Bowel sounds are normal.     Palpations: Abdomen is soft.     Tenderness: There is abdominal tenderness (Minimal epigastric tenderness no guarding or rebound tenderness.  Negative Murphy sign, no pain at McBurney's point). There is no right CVA tenderness, left CVA tenderness, guarding or rebound. Negative signs include Murphy's sign, Rovsing's sign  and McBurney's sign.  Musculoskeletal:     Cervical back: Neck supple.  Skin:    Findings: No rash.  Neurological:     Mental Status: She is alert.     ED Results / Procedures / Treatments   Labs (all labs ordered are listed, but only abnormal results are displayed) Labs Reviewed  CBC - Abnormal; Notable for the following components:      Result Value   WBC 11.3 (*)    MCV 79.4 (*)    MCH 24.8 (*)    All other components within normal limits  BASIC METABOLIC PANEL - Abnormal; Notable for the following components:   Potassium 3.0 (*)    Chloride 97 (*)    Glucose, Bld 110 (*)    Creatinine, Ser 1.20 (*)    GFR calc non Af Amer 52 (*)    All other components within normal limits  URINALYSIS, ROUTINE W REFLEX MICROSCOPIC    EKG None  Radiology No results found.  Procedures Procedures (including critical care time)  Medications Ordered in ED Medications  sodium chloride 0.9 % bolus 1,000 mL (1,000 mLs Intravenous New Bag/Given 01/02/20 2208)  ondansetron (ZOFRAN) injection 4 mg (4 mg Intravenous Given 01/02/20 2209)  morphine 4 MG/ML injection 4 mg (4 mg Intravenous Given 01/02/20 2209)  potassium chloride SA (KLOR-CON) CR tablet 40 mEq (40 mEq Oral Given 01/02/20 2208)    ED Course  I have reviewed the triage vital signs and the nursing notes.  Pertinent labs & imaging results that were available during my care of the patient were reviewed by me and considered in my medical decision making (see chart for details).    MDM Rules/Calculators/A&P                          BP 133/85 (BP Location: Right Arm)   Pulse 81   Temp 98.9 F (37.2 C) (Oral)   Resp 18   Ht 5\' 7"  (1.702 m)   Wt 119.3 kg   LMP 05/06/2016   SpO2 97%   BMI 41.19 kg/m   Final Clinical Impression(s) / ED Diagnoses Final diagnoses:  Nausea vomiting and diarrhea  Hypokalemia  Dehydration    Rx / DC Orders ED Discharge Orders         Ordered    ondansetron (ZOFRAN ODT) 4 MG  disintegrating tablet     Discontinue  Reprint  01/02/20 2244    potassium chloride SA (KLOR-CON) 20 MEQ tablet  Daily     Discontinue  Reprint     01/02/20 2244         9:31 PM Patient here with nausea vomiting diarrhea for several days.  She has minimal abdominal tenderness on exam.  She is otherwise well-appearing, afebrile, vital signs stable.  Labs remarkable for hypokalemia with potassium of 3.0.  Will give supplementation, mild AKI with a creatinine of 1.2, will give IV fluid.  Antinausea medication given.  Suspect viral GI cause.  10:46 PM Abdomen is soft nontender on reexamination.  Patient has received IV fluid.  Patient discharged home with symptomatic treatment and outpatient follow-up.  Return precaution discussed.   Domenic Moras, PA-C 01/02/20 2246    Daleen Bo, MD 01/04/20 1036

## 2020-01-02 NOTE — Discharge Instructions (Signed)
You have symptoms may due to a viral stomach infection.  Take Zofran as needed for nausea.  Your potassium is low today, take supplementation as prescribed and have it rechecked by your primary care doctor next week.  Return if your condition worsen or if you have any other concern.

## 2020-01-02 NOTE — ED Triage Notes (Signed)
Patient states abdominal pain with nausea, vomiting, and diarrhea x 3 days.

## 2020-01-12 DIAGNOSIS — Z6841 Body Mass Index (BMI) 40.0 and over, adult: Secondary | ICD-10-CM | POA: Diagnosis not present

## 2020-01-12 DIAGNOSIS — E119 Type 2 diabetes mellitus without complications: Secondary | ICD-10-CM | POA: Diagnosis not present

## 2020-01-12 DIAGNOSIS — Z0001 Encounter for general adult medical examination with abnormal findings: Secondary | ICD-10-CM | POA: Diagnosis not present

## 2020-01-12 DIAGNOSIS — I10 Essential (primary) hypertension: Secondary | ICD-10-CM | POA: Diagnosis not present

## 2020-01-12 DIAGNOSIS — Z1389 Encounter for screening for other disorder: Secondary | ICD-10-CM | POA: Diagnosis not present

## 2020-01-23 DIAGNOSIS — I10 Essential (primary) hypertension: Secondary | ICD-10-CM | POA: Diagnosis not present

## 2020-01-23 DIAGNOSIS — Z6836 Body mass index (BMI) 36.0-36.9, adult: Secondary | ICD-10-CM | POA: Diagnosis not present

## 2020-01-23 DIAGNOSIS — Z124 Encounter for screening for malignant neoplasm of cervix: Secondary | ICD-10-CM | POA: Diagnosis not present

## 2020-01-23 DIAGNOSIS — E6609 Other obesity due to excess calories: Secondary | ICD-10-CM | POA: Diagnosis not present

## 2020-01-23 DIAGNOSIS — Z0001 Encounter for general adult medical examination with abnormal findings: Secondary | ICD-10-CM | POA: Diagnosis not present

## 2020-01-23 DIAGNOSIS — E119 Type 2 diabetes mellitus without complications: Secondary | ICD-10-CM | POA: Diagnosis not present

## 2020-01-23 LAB — HM PAP SMEAR: HM Pap smear: NORMAL

## 2020-01-26 ENCOUNTER — Other Ambulatory Visit (HOSPITAL_COMMUNITY): Payer: Self-pay | Admitting: Family Medicine

## 2020-01-26 DIAGNOSIS — Z1231 Encounter for screening mammogram for malignant neoplasm of breast: Secondary | ICD-10-CM

## 2020-02-13 ENCOUNTER — Ambulatory Visit (HOSPITAL_COMMUNITY): Payer: BC Managed Care – PPO

## 2020-02-25 ENCOUNTER — Ambulatory Visit (HOSPITAL_COMMUNITY): Payer: BC Managed Care – PPO

## 2020-04-14 DIAGNOSIS — F4325 Adjustment disorder with mixed disturbance of emotions and conduct: Secondary | ICD-10-CM | POA: Diagnosis not present

## 2020-05-05 DIAGNOSIS — F4325 Adjustment disorder with mixed disturbance of emotions and conduct: Secondary | ICD-10-CM | POA: Diagnosis not present

## 2020-05-12 DIAGNOSIS — F4325 Adjustment disorder with mixed disturbance of emotions and conduct: Secondary | ICD-10-CM | POA: Diagnosis not present

## 2020-06-30 DIAGNOSIS — Z6838 Body mass index (BMI) 38.0-38.9, adult: Secondary | ICD-10-CM | POA: Diagnosis not present

## 2020-06-30 DIAGNOSIS — I10 Essential (primary) hypertension: Secondary | ICD-10-CM | POA: Diagnosis not present

## 2020-08-26 ENCOUNTER — Other Ambulatory Visit: Payer: Medicaid Other

## 2020-08-26 DIAGNOSIS — Z20822 Contact with and (suspected) exposure to covid-19: Secondary | ICD-10-CM

## 2020-08-27 LAB — NOVEL CORONAVIRUS, NAA: SARS-CoV-2, NAA: NOT DETECTED

## 2020-08-27 LAB — SARS-COV-2, NAA 2 DAY TAT

## 2021-03-04 ENCOUNTER — Other Ambulatory Visit: Payer: Self-pay

## 2021-03-04 ENCOUNTER — Emergency Department (HOSPITAL_COMMUNITY): Payer: 59

## 2021-03-04 ENCOUNTER — Emergency Department (HOSPITAL_COMMUNITY)
Admission: EM | Admit: 2021-03-04 | Discharge: 2021-03-04 | Disposition: A | Discharge status: Home / Self Care | Payer: 59 | Attending: Emergency Medicine | Admitting: Emergency Medicine

## 2021-03-04 DIAGNOSIS — Z79899 Other long term (current) drug therapy: Secondary | ICD-10-CM | POA: Diagnosis not present

## 2021-03-04 DIAGNOSIS — I10 Essential (primary) hypertension: Secondary | ICD-10-CM | POA: Diagnosis not present

## 2021-03-04 DIAGNOSIS — Z7984 Long term (current) use of oral hypoglycemic drugs: Secondary | ICD-10-CM | POA: Insufficient documentation

## 2021-03-04 DIAGNOSIS — E119 Type 2 diabetes mellitus without complications: Secondary | ICD-10-CM | POA: Diagnosis not present

## 2021-03-04 DIAGNOSIS — R519 Headache, unspecified: Secondary | ICD-10-CM

## 2021-03-04 MED ORDER — HYDROCODONE-ACETAMINOPHEN 5-325 MG PO TABS
1.0000 | ORAL_TABLET | Freq: Once | ORAL | Status: AC
  Administered 2021-03-04: 1 via ORAL
  Filled 2021-03-04: qty 1

## 2021-03-04 NOTE — Discharge Instructions (Addendum)
Take Tylenol or Motrin for your headache and follow-up with your family doctor next week to see how your blood pressure is doing and your headaches

## 2021-03-04 NOTE — ED Triage Notes (Signed)
Pt states that she woke up this morning with a headache hasn't taken anything. She also takes b/p meds but hast not taken them this morning.

## 2021-03-04 NOTE — ED Provider Notes (Signed)
[facility] EMERGENCY DEPARTMENT Provider Note   CSN: [id number] Arrival date & time: 03/04/21  0855     History Chief Complaint  Patient presents with   Headache    Laurie Brown is a 55 y.o. female.  Pt complains of a headache.   she did not take her blood pressure medicines today.  Patient has not had her blood pressure medicine either today  The history is provided by the patient. No language interpreter was used.  Headache Pain location:  Generalized Radiates to:  Does not radiate Severity currently:  6/10 Severity at highest:  7/10 Onset quality:  Sudden Timing:  Constant Progression:  Waxing and waning Chronicity:  New Similar to prior headaches: no   Context: not activity   Relieved by:  Nothing Worsened by:  Nothing Ineffective treatments:  None tried Associated symptoms: no abdominal pain, no back pain, no congestion, no cough, no diarrhea, no fatigue, no seizures and no sinus pressure       Past Medical History:  Diagnosis Date   Diabetes mellitus without complication ([place])    Hypertension     There are no problems to display for this patient.   Past Surgical History:  Procedure Laterality Date   TUBAL LIGATION     TUBAL LIGATION       OB History     Gravida  1   Para      Term      Preterm      AB      Living  1      SAB      IAB      Ectopic      Multiple      Live Births              No family history on file.  Social History   Tobacco Use   Smoking status: Never   Smokeless tobacco: Never  Vaping Use   Vaping Use: Never used  Substance Use Topics   Alcohol use: No   Drug use: No    Home Medications Prior to Admission medications   Medication Sig Start Date End Date Taking? Authorizing Provider  metFORMIN (GLUCOPHAGE) 500 MG tablet Take 500 mg by mouth 2 (two) times daily. 01/18/21  Yes [provider]  metoprolol (LOPRESSOR) 100 MG tablet Take 100 mg by mouth 2 (two) times daily.    Yes  [provider]  triamterene-hydrochlorothiazide (MAXZIDE) 75-50 MG tablet Take 1 tablet by mouth daily. 02/19/21  Yes [provider]    Allergies    Lisinopril  Review of Systems   Review of Systems  Constitutional:  Negative for appetite change and fatigue.  HENT:  Negative for congestion, ear discharge and sinus pressure.   Eyes:  Negative for discharge.  Respiratory:  Negative for cough.   Cardiovascular:  Negative for chest pain.  Gastrointestinal:  Negative for abdominal pain and diarrhea.  Genitourinary:  Negative for frequency and hematuria.  Musculoskeletal:  Negative for back pain.  Skin:  Negative for rash.  Neurological:  Positive for headaches. Negative for seizures.  Psychiatric/Behavioral:  Negative for hallucinations.    Physical Exam Updated Vital Signs BP (!) 145/98   Pulse 75   Temp 97.9 F (36.6 C) (Oral)   Resp 16   LMP 05/06/2016   SpO2 99%   Physical Exam Vitals and nursing note reviewed.  Constitutional:      Appearance: She is well-developed.  HENT:     Head:  Normocephalic.     Nose: Nose normal.  Eyes:     General: No scleral icterus.    Conjunctiva/sclera: Conjunctivae normal.  Neck:     Thyroid: No thyromegaly.  Cardiovascular:     Rate and Rhythm: Normal rate and regular rhythm.     Heart sounds: No murmur heard.   No friction rub. No gallop.  Pulmonary:     Breath sounds: No stridor. No wheezing or rales.  Chest:     Chest wall: No tenderness.  Abdominal:     General: There is no distension.     Tenderness: There is no abdominal tenderness. There is no rebound.  Musculoskeletal:        General: Normal range of motion.     Cervical back: Neck supple.  Lymphadenopathy:     Cervical: No cervical adenopathy.  Skin:    Findings: No erythema or rash.  Neurological:     Mental Status: She is alert and oriented to person, place, and time.     Motor: No abnormal muscle tone.     Coordination: Coordination normal.   Psychiatric:        Behavior: Behavior normal.    ED Results / Procedures / Treatments   Labs (all labs ordered are listed, but only abnormal results are displayed) Labs Reviewed - No data to display  EKG None  Radiology CT HEAD WO CONTRAST (5MM)  Result Date: 03/04/2021 CLINICAL DATA:  Headache. EXAM: CT HEAD WITHOUT CONTRAST TECHNIQUE: Contiguous axial images were obtained from the base of the skull through the vertex without intravenous contrast. COMPARISON:  CT head dated October 12, 2003. FINDINGS: Brain: No evidence of acute infarction, hemorrhage, hydrocephalus, extra-axial collection or mass lesion/mass effect. Vascular: Atherosclerotic vascular calcification of the carotid siphons. No hyperdense vessel. Skull: Normal. Negative for fracture or focal lesion. Sinuses/Orbits: No acute finding. Other: None. IMPRESSION: 1. No acute intracranial abnormality. Electronically Signed   By: Titus Dubin M.D.   On: 03/04/2021 10:39    Procedures Procedures   Medications Ordered in ED Medications  HYDROcodone-acetaminophen (NORCO/VICODIN) 5-325 MG per tablet 1 tablet (1 tablet Oral Given 03/04/21 0932)    ED Course  I have reviewed the triage vital signs and the nursing notes.  Pertinent labs & imaging results that were available during my care of the patient were reviewed by me and considered in my medical decision making (see chart for details).    MDM Rules/Calculators/A&P                           Patient with headache and hypertension.  Patient improved with treatment.  She will follow-up with her doctor Final Clinical Impression(s) / ED Diagnoses Final diagnoses:  Bad headache  Primary hypertension    Rx / DC Orders ED Discharge Orders     None        Milton Ferguson, MD 03/04/21 1138

## 2021-12-22 ENCOUNTER — Ambulatory Visit (INDEPENDENT_AMBULATORY_CARE_PROVIDER_SITE_OTHER): Payer: PRIVATE HEALTH INSURANCE | Admitting: Family Medicine

## 2021-12-22 ENCOUNTER — Encounter: Payer: Self-pay | Admitting: Family Medicine

## 2021-12-22 VITALS — BP 136/84 | HR 61 | Resp 18 | Ht 67.0 in | Wt 251.4 lb

## 2021-12-22 DIAGNOSIS — Z1211 Encounter for screening for malignant neoplasm of colon: Secondary | ICD-10-CM | POA: Diagnosis not present

## 2021-12-22 DIAGNOSIS — Z23 Encounter for immunization: Secondary | ICD-10-CM | POA: Diagnosis not present

## 2021-12-22 DIAGNOSIS — N3090 Cystitis, unspecified without hematuria: Secondary | ICD-10-CM

## 2021-12-22 DIAGNOSIS — E559 Vitamin D deficiency, unspecified: Secondary | ICD-10-CM

## 2021-12-22 DIAGNOSIS — E119 Type 2 diabetes mellitus without complications: Secondary | ICD-10-CM

## 2021-12-22 DIAGNOSIS — Z1231 Encounter for screening mammogram for malignant neoplasm of breast: Secondary | ICD-10-CM

## 2021-12-22 DIAGNOSIS — Z1159 Encounter for screening for other viral diseases: Secondary | ICD-10-CM

## 2021-12-22 DIAGNOSIS — Z0001 Encounter for general adult medical examination with abnormal findings: Secondary | ICD-10-CM

## 2021-12-22 DIAGNOSIS — M545 Low back pain, unspecified: Secondary | ICD-10-CM

## 2021-12-22 DIAGNOSIS — Z114 Encounter for screening for human immunodeficiency virus [HIV]: Secondary | ICD-10-CM

## 2021-12-22 DIAGNOSIS — I10 Essential (primary) hypertension: Secondary | ICD-10-CM

## 2021-12-22 DIAGNOSIS — R7301 Impaired fasting glucose: Secondary | ICD-10-CM

## 2021-12-22 LAB — POCT URINALYSIS DIP (CLINITEK)
Bilirubin, UA: NEGATIVE
Glucose, UA: NEGATIVE mg/dL
Ketones, POC UA: NEGATIVE mg/dL
Nitrite, UA: NEGATIVE
POC PROTEIN,UA: >=300 — AB
Spec Grav, UA: >=1.03 — AB (ref 1.010–1.025)
Urobilinogen, UA: 0.2 E.U./dL
pH, UA: 6 (ref 5.0–8.0)

## 2021-12-22 MED ORDER — SULFAMETHOXAZOLE-TRIMETHOPRIM 800-160 MG PO TABS: 1.0000 | ORAL_TABLET | Freq: Two times a day (BID) | ORAL | 0 refills | Status: AC

## 2021-12-22 MED ORDER — TRIAMTERENE-HCTZ 75-50 MG PO TABS: 1.0000 | ORAL_TABLET | Freq: Every day | ORAL | 1 refills | Status: DC

## 2021-12-22 MED ORDER — METOPROLOL TARTRATE 100 MG PO TABS: 100.0000 mg | ORAL_TABLET | Freq: Two times a day (BID) | ORAL | 6 refills | Status: DC

## 2021-12-22 MED ORDER — METFORMIN HCL 500 MG PO TABS: 500.0000 mg | ORAL_TABLET | Freq: Two times a day (BID) | ORAL | 2 refills | Status: DC

## 2021-12-22 NOTE — Patient Instructions (Addendum)
I appreciate the opportunity to provide care to you today!    Follow up:  3 months  Labs: please stop by the lab today to get your blood drawn (CBC, CMP, TSH, Lipid profile, HgA1c, Vit D)  Screening: HIV and Hep C.  Please complete the full course of the antibiotics   You can help prevent UTIs by doing the following:  -Avoid holding urine for prolonged periods; this stretches the bladder and causes bacteria to form because bacteria like warm and wet environments to grow -Empty the bladder as soon as the need arises.  -Empty your bladder soon after intercourse.  -Take showers instead of baths -Wipe front to back; doing so after urinating and after a bowel movement helps prevent bacteria in the anal region from spreading to the vagina and urethra. -Also, drink a full glass of water to help flush bacteria.    Thank you for getting your Tdap and Shingles vaccine    Please continue to a heart-healthy diet and increase your physical activities. Try to exercise for 67mns at least three times a week.      It was a pleasure to see you and I look forward to continuing to work together on your health and well-being. Please do not hesitate to call the office if you need care or have questions about your care.   Have a wonderful day and week. With Gratitude, GAlvira MondayMSN, FNP-BC

## 2021-12-22 NOTE — Progress Notes (Addendum)
New Patient Office Visit  Subjective:  Patient ID: Laurie Brown, female    DOB: 1966-02-02  Age: 56 y.o. MRN: [id number]  CC:  Chief Complaint  Patient presents with   New Patient (Initial Visit)    New patient has lower back pain has been going on for two days previously saw dr Collene Mares     HPI Laurie Brown is a 56 y.o. female with past medical history of hypertension and T2DM presents for establishing care with c/o of low back pain, urgency, and frequency for two days. She denies urinary complaints, as seen in the ROS.   Past Medical History:  Diagnosis Date   Diabetes mellitus without complication ([place])    Hypertension     Past Surgical History:  Procedure Laterality Date   TUBAL LIGATION     TUBAL LIGATION      History reviewed. No pertinent family history.  Social History   Socioeconomic History   Marital status: Divorced    Spouse name: Not on file   Number of children: Not on file   Years of education: Not on file   Highest education level: Not on file  Occupational History   Not on file  Tobacco Use   Smoking status: Never   Smokeless tobacco: Never  Vaping Use   Vaping Use: Never used  Substance and Sexual Activity   Alcohol use: No   Drug use: No   Sexual activity: Yes    Birth control/protection: Surgical  Other Topics Concern   Not on file  Social History Narrative   Not on file   Social Determinants of Health   Financial Resource Strain: Not on file  Food Insecurity: Not on file  Transportation Needs: Not on file  Physical Activity: Not on file  Stress: Not on file  Social Connections: Not on file  Intimate Partner Violence: Not on file    ROS Review of Systems  Constitutional:  Positive for fatigue. Negative for chills and fever.  HENT:  Negative for sinus pressure, sinus pain, sneezing and sore throat.   Eyes:  Negative for pain, itching and visual disturbance.  Respiratory:  Negative for chest tightness and shortness of breath.    Cardiovascular:  Negative for chest pain and palpitations.  Gastrointestinal:  Negative for constipation, diarrhea, nausea and vomiting.  Endocrine: Negative for polydipsia, polyphagia and polyuria.  Genitourinary:  Positive for frequency and urgency. Negative for difficulty urinating, dysuria, hematuria and pelvic pain.  Musculoskeletal:  Positive for back pain. Negative for neck pain.  Skin:  Negative for rash and wound.  Neurological:  Negative for dizziness, tremors, weakness and headaches.  Psychiatric/Behavioral:  Negative for confusion, self-injury and suicidal ideas.     Objective:   Today's Vitals: BP 136/84 (BP Location: Right Arm, Patient Position: Sitting, Cuff Size: Normal)   Pulse 61   Resp 18   Ht _0  (1.702 m)   Wt 251 lb 6.4 oz (114 kg)   LMP 05/06/2016   SpO2 100%   BMI 39.37 kg/m   Physical Exam HENT:     Head: Normocephalic.     Right Ear: External ear normal.     Left Ear: External ear normal.     Nose: No congestion.     Mouth/Throat:     Mouth: Mucous membranes are moist.  Eyes:     Extraocular Movements: Extraocular movements intact.     Pupils: Pupils are equal, round, and reactive to light.  Cardiovascular:     Rate  and Rhythm: Normal rate and regular rhythm.     Pulses: Normal pulses.     Heart sounds: Normal heart sounds.  Pulmonary:     Effort: Pulmonary effort is normal.     Breath sounds: Normal breath sounds.  Abdominal:     Palpations: Abdomen is soft.     Tenderness: There is no abdominal tenderness. There is no right CVA tenderness or left CVA tenderness.  Musculoskeletal:     Cervical back: No rigidity.     Right lower leg: No edema.     Left lower leg: No edema.  Skin:    General: Skin is warm.  Neurological:     Mental Status: She is alert and oriented to person, place, and time.  Psychiatric:     Comments: Normal affect     Assessment & Plan:   Problem List Items Addressed This Visit       Cardiovascular and  Mediastinum   Hypertension   Relevant Medications   metoprolol tartrate (LOPRESSOR) 100 MG tablet   triamterene-hydrochlorothiazide (MAXZIDE) 75-50 MG tablet     Endocrine   Type 2 diabetes mellitus without complication (HCC)   Relevant Medications   metFORMIN (GLUCOPHAGE) 500 MG tablet   Other Relevant Orders   Microalbumin / creatinine urine ratio     Genitourinary   Cystitis - Primary    -Urinalysis is positive for leukocytes -Pending culture -Will treat prophylactically with Bactrim -Encouraged to empty the bladder as soon as the need arises.  -Encouraged to empty your bladder soon after intercourse.  -Encouraged to take showers instead of baths -Encouraged to wipe front to back; doing so after urinating and after a bowel movement helps prevent bacteria in the anal region from spreading to the vagina and urethra. - Encouraged to drink a full glass of water to help flush bacteria      Relevant Medications   sulfamethoxazole-trimethoprim (BACTRIM DS) 800-160 MG tablet   Other Visit Diagnoses     Special screening for malignant neoplasms, colon       Relevant Orders   Cologuard   Screening mammogram for breast cancer       Relevant Orders   MM 3D SCREEN BREAST BILATERAL   Low back pain, unspecified back pain laterality, unspecified chronicity, unspecified whether sciatica present       Relevant Orders   POCT URINALYSIS DIP (CLINITEK) (Completed)   Urine Culture   Need for tetanus, diphtheria, and acellular pertussis (Tdap) vaccine       Relevant Orders   Tdap vaccine greater than or equal to 7yo IM (Completed)   Need for shingles vaccine       Relevant Orders   Varicella-zoster vaccine IM (Shingrix) (Completed)   Encounter for screening for HIV       Relevant Medications   sulfamethoxazole-trimethoprim (BACTRIM DS) 800-160 MG tablet   Other Relevant Orders   HIV antibody (with reflex)   Need for hepatitis C screening test       Relevant Orders   Hepatitis C  antibody   Vitamin D deficiency       Relevant Orders   Vitamin D (25 hydroxy)   IFG (impaired fasting glucose)       Relevant Orders   Hemoglobin A1C   Encounter for general adult medical examination with abnormal findings       Relevant Orders   CBC with Differential/Platelet   CMP14+EGFR   TSH + free T4   Lipid panel  Outpatient Encounter Medications as of 12/22/2021  Medication Sig   sulfamethoxazole-trimethoprim (BACTRIM DS) 800-160 MG tablet Take 1 tablet by mouth 2 (two) times daily for 7 days.   [DISCONTINUED] metFORMIN (GLUCOPHAGE) 500 MG tablet Take 500 mg by mouth 2 (two) times daily.   [DISCONTINUED] metoprolol (LOPRESSOR) 100 MG tablet Take 100 mg by mouth 2 (two) times daily.    [DISCONTINUED] triamterene-hydrochlorothiazide (MAXZIDE) 75-50 MG tablet Take 1 tablet by mouth daily.   metFORMIN (GLUCOPHAGE) 500 MG tablet Take 1 tablet (500 mg total) by mouth 2 (two) times daily.   metoprolol tartrate (LOPRESSOR) 100 MG tablet Take 1 tablet (100 mg total) by mouth 2 (two) times daily.   triamterene-hydrochlorothiazide (MAXZIDE) 75-50 MG tablet Take 1 tablet by mouth daily.   No facility-administered encounter medications on file as of 12/22/2021.    Follow-up: Return in about 3 months (around 03/24/2022).   Alvira Monday, FNP

## 2021-12-22 NOTE — Assessment & Plan Note (Addendum)
-  Urinalysis is positive for leukocytes -Pending culture -Will treat prophylactically with Bactrim -Encouraged to empty the bladder as soon as the need arises.  -Encouraged to empty your bladder soon after intercourse.  -Encouraged to take showers instead of baths -Encouraged to wipe front to back; doing so after urinating and after a bowel movement helps prevent bacteria in the anal region from spreading to the vagina and urethra. - Encouraged to drink a full glass of water to help flush bacteria

## 2021-12-23 LAB — CBC WITH DIFFERENTIAL/PLATELET
Basophils Absolute: 0 10*3/uL (ref 0.0–0.2)
Basos: 1 %
EOS (ABSOLUTE): 0.3 10*3/uL (ref 0.0–0.4)
Eos: 5 %
Hematocrit: 41.6 % (ref 34.0–46.6)
Hemoglobin: 13 g/dL (ref 11.1–15.9)
Immature Grans (Abs): 0 10*3/uL (ref 0.0–0.1)
Immature Granulocytes: 0 %
Lymphocytes Absolute: 2.1 10*3/uL (ref 0.7–3.1)
Lymphs: 38 %
MCH: 23.6 pg — ABNORMAL LOW (ref 26.6–33.0)
MCHC: 31.3 g/dL — ABNORMAL LOW (ref 31.5–35.7)
MCV: 76 fL — ABNORMAL LOW (ref 79–97)
Monocytes Absolute: 0.4 10*3/uL (ref 0.1–0.9)
Monocytes: 8 %
Neutrophils Absolute: 2.8 10*3/uL (ref 1.4–7.0)
Neutrophils: 48 %
Platelets: 378 10*3/uL (ref 150–450)
RBC: 5.5 x10E6/uL — ABNORMAL HIGH (ref 3.77–5.28)
RDW: 16 % — ABNORMAL HIGH (ref 11.7–15.4)
WBC: 5.6 10*3/uL (ref 3.4–10.8)

## 2021-12-23 LAB — CMP14+EGFR
ALT: 21 IU/L (ref 0–32)
AST: 24 IU/L (ref 0–40)
Albumin/Globulin Ratio: 1 — ABNORMAL LOW (ref 1.2–2.2)
Albumin: 4.1 g/dL (ref 3.8–4.9)
Alkaline Phosphatase: 98 IU/L (ref 44–121)
BUN/Creatinine Ratio: 12 (ref 9–23)
BUN: 11 mg/dL (ref 6–24)
Bilirubin Total: 0.3 mg/dL (ref 0.0–1.2)
CO2: 26 mmol/L (ref 20–29)
Calcium: 10.1 mg/dL (ref 8.7–10.2)
Chloride: 106 mmol/L (ref 96–106)
Creatinine, Ser: 0.95 mg/dL (ref 0.57–1.00)
Globulin, Total: 4.1 g/dL (ref 1.5–4.5)
Glucose: 84 mg/dL (ref 70–99)
Potassium: 4 mmol/L (ref 3.5–5.2)
Sodium: 144 mmol/L (ref 134–144)
Total Protein: 8.2 g/dL (ref 6.0–8.5)
eGFR: 71 mL/min/{1.73_m2} (ref >59)

## 2021-12-23 LAB — LIPID PANEL
Chol/HDL Ratio: 5.7 ratio — ABNORMAL HIGH (ref 0.0–4.4)
Cholesterol, Total: 188 mg/dL (ref 100–199)
HDL: 33 mg/dL — ABNORMAL LOW (ref >39)
LDL Chol Calc (NIH): 134 mg/dL — ABNORMAL HIGH (ref 0–99)
Triglycerides: 115 mg/dL (ref 0–149)
VLDL Cholesterol Cal: 21 mg/dL (ref 5–40)

## 2021-12-23 LAB — HIV ANTIBODY (ROUTINE TESTING W REFLEX): HIV Screen 4th Generation wRfx: NONREACTIVE

## 2021-12-23 LAB — HEMOGLOBIN A1C
Est. average glucose Bld gHb Est-mCnc: 131 mg/dL
Hgb A1c MFr Bld: 6.2 % — ABNORMAL HIGH (ref 4.8–5.6)

## 2021-12-23 LAB — TSH+FREE T4
Free T4: 1.02 ng/dL (ref 0.82–1.77)
TSH: 1.47 u[IU]/mL (ref 0.450–4.500)

## 2021-12-23 LAB — VITAMIN D 25 HYDROXY (VIT D DEFICIENCY, FRACTURES): Vit D, 25-Hydroxy: 21.2 ng/mL — ABNORMAL LOW (ref 30.0–100.0)

## 2021-12-23 LAB — HEPATITIS C ANTIBODY: Hep C Virus Ab: UNDETERMINED — AB

## 2021-12-24 LAB — URINE CULTURE

## 2021-12-27 ENCOUNTER — Other Ambulatory Visit: Payer: Self-pay | Admitting: Family Medicine

## 2021-12-27 DIAGNOSIS — E785 Hyperlipidemia, unspecified: Secondary | ICD-10-CM

## 2021-12-27 DIAGNOSIS — Z1159 Encounter for screening for other viral diseases: Secondary | ICD-10-CM

## 2021-12-27 MED ORDER — ROSUVASTATIN CALCIUM 10 MG PO TABS: 10.0000 mg | ORAL_TABLET | Freq: Every day | ORAL | 3 refills | Status: DC

## 2021-12-27 NOTE — Progress Notes (Unsigned)
The 10-year ASCVD risk score (Arnett DK, et al., 2019) is: 20.5%   Values used to calculate the score:     Age: 56 years     Sex: Female     Is Non-Hispanic African American: Yes     Diabetic: Yes     Tobacco smoker: No     Systolic Blood Pressure: 093 mmHg     Is BP treated: Yes     HDL Cholesterol: 33 mg/dL     Total Cholesterol: 188 mg/dL

## 2021-12-29 ENCOUNTER — Other Ambulatory Visit: Payer: Self-pay | Admitting: Family Medicine

## 2021-12-29 ENCOUNTER — Encounter: Payer: Self-pay | Admitting: Family Medicine

## 2021-12-29 DIAGNOSIS — Z1159 Encounter for screening for other viral diseases: Secondary | ICD-10-CM

## 2021-12-29 DIAGNOSIS — D649 Anemia, unspecified: Secondary | ICD-10-CM

## 2021-12-29 NOTE — Progress Notes (Unsigned)
The 10-year ASCVD risk score (Arnett DK, et al., 2019) is: 20.5%   Values used to calculate the score:     Age: 56 years     Sex: Female     Is Non-Hispanic African American: Yes     Diabetic: Yes     Tobacco smoker: No     Systolic Blood Pressure: 856 mmHg     Is BP treated: Yes     HDL Cholesterol: 33 mg/dL     Total Cholesterol: 188 mg/dL

## 2021-12-29 NOTE — Progress Notes (Signed)
Please inform the patient to come to the clinic to get labs. I've ordered an iron panel and hepatitis RNA  test to identify current hepatitis infections. Her cholesterol is elevated, and I've started her on statins. She can pick up the prescription at the pharmacy. She has prediabetes. I recommend wt loss, low fat and calories diet. I recommend taking Vit. D 5000iu otc daily for her low vit D

## 2021-12-29 NOTE — Progress Notes (Unsigned)
Pap documentation

## 2021-12-30 ENCOUNTER — Other Ambulatory Visit: Payer: Self-pay | Admitting: Family Medicine

## 2022-02-16 LAB — COLOGUARD: COLOGUARD: NEGATIVE

## 2022-02-17 NOTE — Progress Notes (Signed)
Please inform the patient that her cologurad is negative for colon cancer.

## 2022-03-24 ENCOUNTER — Ambulatory Visit: Payer: PRIVATE HEALTH INSURANCE | Admitting: Family Medicine

## 2022-03-28 ENCOUNTER — Other Ambulatory Visit: Payer: Self-pay | Admitting: Family Medicine

## 2022-03-28 DIAGNOSIS — E119 Type 2 diabetes mellitus without complications: Secondary | ICD-10-CM

## 2022-04-03 ENCOUNTER — Encounter: Payer: Self-pay | Admitting: Family Medicine

## 2022-04-03 ENCOUNTER — Ambulatory Visit (INDEPENDENT_AMBULATORY_CARE_PROVIDER_SITE_OTHER): Payer: PRIVATE HEALTH INSURANCE | Admitting: Family Medicine

## 2022-04-03 VITALS — BP 132/72 | HR 87 | Ht 67.0 in | Wt 253.1 lb

## 2022-04-03 DIAGNOSIS — E119 Type 2 diabetes mellitus without complications: Secondary | ICD-10-CM

## 2022-04-03 DIAGNOSIS — R7301 Impaired fasting glucose: Secondary | ICD-10-CM | POA: Diagnosis not present

## 2022-04-03 DIAGNOSIS — Z23 Encounter for immunization: Secondary | ICD-10-CM | POA: Diagnosis not present

## 2022-04-03 DIAGNOSIS — D649 Anemia, unspecified: Secondary | ICD-10-CM

## 2022-04-03 DIAGNOSIS — Z2821 Immunization not carried out because of patient refusal: Secondary | ICD-10-CM | POA: Diagnosis not present

## 2022-04-03 DIAGNOSIS — I10 Essential (primary) hypertension: Secondary | ICD-10-CM

## 2022-04-03 DIAGNOSIS — E559 Vitamin D deficiency, unspecified: Secondary | ICD-10-CM

## 2022-04-03 MED ORDER — METFORMIN HCL 500 MG PO TABS: ORAL_TABLET | ORAL | 2 refills | Status: DC

## 2022-04-03 NOTE — Assessment & Plan Note (Addendum)
BP is controlled Reports consuming less than two bottles of water daily She denies chest pain and visual disturbances She reports treatment adherence to metoprolol 100 mg BID Encouraged to increase her fluid intake  Encouraged to drink  64 ounces of water daily

## 2022-04-03 NOTE — Patient Instructions (Addendum)
I appreciate the opportunity to provide care to you today!    Follow up:  3 months  Labs: please stop by the lab today to get your blood drawn (CBC, CMP, TSH, Lipid profile, HgA1c, Vit D)   Please pick up your refill at the pharmacy   Please increase your fluid in take to at least 3-4 bottles of water daily  Please stop by [facility] hospital anytime to get an x-ray of the left hip and both hands    Please continue to a heart-healthy diet and increase your physical activities. Try to exercise for 37mns at least three times a week.      It was a pleasure to see you and I look forward to continuing to work together on your health and well-being. Please do not hesitate to call the office if you need care or have questions about your care.   Have a wonderful day and week. With Gratitude, GAlvira MondayMSN, FNP-BC

## 2022-04-03 NOTE — Progress Notes (Signed)
Established Patient Office Visit  Subjective:  Patient ID: Laurie Brown, female    DOB: Sep 08, 1965  Age: 56 y.o. MRN: [id number]  CC:  Chief Complaint  Patient presents with   Hypertension    Pt states blood pressure has been elevated she has been feeling lightheaded, just feeling off since last week.    Blood Sugar Problem    Pt c/o feeling sx of blood sugar being off since last week.     HPI Laurie Brown is a 56 y.o. female with past medical history of HTN, T2DM and anemia presents with c/o of lightheadedness since last week.  HTN: BP is controlled. Reports consuming less than two bottles of water daily. She denies chest pain and visual disturbances. She reports treatment adherence to metoprolol 100 mg BID   T2DM: she reports increased polyuria but denies polydipsia and polyphagia. She denies urinary symptoms. She reports not checking her glucose this morning, but noted that her highest blood glucose is 113 and lowest is 68. She reports treatment adherence to metformin 500 mg daily.     Past Medical History:  Diagnosis Date   Diabetes mellitus without complication ([place])    Hypertension     Past Surgical History:  Procedure Laterality Date   TUBAL LIGATION     TUBAL LIGATION      History reviewed. No pertinent family history.  Social History   Socioeconomic History   Marital status: Divorced    Spouse name: Not on file   Number of children: Not on file   Years of education: Not on file   Highest education level: Not on file  Occupational History   Not on file  Tobacco Use   Smoking status: Never   Smokeless tobacco: Never  Vaping Use   Vaping Use: Never used  Substance and Sexual Activity   Alcohol use: No   Drug use: No   Sexual activity: Yes    Birth control/protection: Surgical  Other Topics Concern   Not on file  Social History Narrative   Not on file   Social Determinants of Health   Financial Resource Strain: Not on file  Food  Insecurity: Not on file  Transportation Needs: Not on file  Physical Activity: Not on file  Stress: Not on file  Social Connections: Not on file  Intimate Partner Violence: Not on file    Outpatient Medications Prior to Visit  Medication Sig Dispense Refill   metoprolol tartrate (LOPRESSOR) 100 MG tablet Take 1 tablet (100 mg total) by mouth 2 (two) times daily. 30 tablet 6   rosuvastatin (CRESTOR) 10 MG tablet Take 1 tablet (10 mg total) by mouth daily. 90 tablet 3   metFORMIN (GLUCOPHAGE) 500 MG tablet TAKE 1 TABLET(500 MG) BY MOUTH TWICE DAILY 60 tablet 2   triamterene-hydrochlorothiazide (MAXZIDE) 75-50 MG tablet Take 1 tablet by mouth daily. 60 tablet 1   No facility-administered medications prior to visit.    Allergies  Allergen Reactions   Lisinopril Cough    ROS Review of Systems  Constitutional:  Negative for fatigue and fever.  Eyes:  Negative for visual disturbance.  Respiratory:  Negative for chest tightness and shortness of breath.   Gastrointestinal:  Negative for nausea and vomiting.  Endocrine: Positive for polyuria. Negative for polydipsia and polyphagia.  Neurological:  Positive for dizziness. Negative for headaches.  Psychiatric/Behavioral:  Negative for sleep disturbance and suicidal ideas.       Objective:    Physical Exam HENT:  Head: Normocephalic.  Cardiovascular:     Rate and Rhythm: Normal rate and regular rhythm.     Pulses: Normal pulses.     Heart sounds: Normal heart sounds.  Pulmonary:     Effort: Pulmonary effort is normal.     Breath sounds: Normal breath sounds.  Neurological:     Mental Status: She is alert.     BP 132/72 (BP Location: Left Arm)   Pulse 87   Ht '5\' 7"'  (1.702 m)   Wt 253 lb 1.9 oz (114.8 kg)   LMP 05/06/2016   SpO2 95%   BMI 39.64 kg/m  Wt Readings from Last 3 Encounters:  04/03/22 253 lb 1.9 oz (114.8 kg)  12/22/21 251 lb 6.4 oz (114 kg)  01/02/20 263 lb (119.3 kg)    Lab Results  Component Value  Date   TSH 1.470 12/22/2021   Lab Results  Component Value Date   WBC 5.6 12/22/2021   HGB 13.0 12/22/2021   HCT 41.6 12/22/2021   MCV 76 (L) 12/22/2021   PLT 378 12/22/2021   Lab Results  Component Value Date   NA 144 12/22/2021   K 4.0 12/22/2021   CO2 26 12/22/2021   GLUCOSE 84 12/22/2021   BUN 11 12/22/2021   CREATININE 0.95 12/22/2021   BILITOT 0.3 12/22/2021   ALKPHOS 98 12/22/2021   AST 24 12/22/2021   ALT 21 12/22/2021   PROT 8.2 12/22/2021   ALBUMIN 4.1 12/22/2021   CALCIUM 10.1 12/22/2021   ANIONGAP 9 01/02/2020   EGFR 71 12/22/2021   Lab Results  Component Value Date   CHOL 188 12/22/2021   Lab Results  Component Value Date   HDL 33 (L) 12/22/2021   Lab Results  Component Value Date   LDLCALC 134 (H) 12/22/2021   Lab Results  Component Value Date   TRIG 115 12/22/2021   Lab Results  Component Value Date   CHOLHDL 5.7 (H) 12/22/2021   Lab Results  Component Value Date   HGBA1C 6.2 (H) 12/22/2021      Assessment & Plan:   Problem List Items Addressed This Visit       Cardiovascular and Mediastinum   Hypertension    BP is controlled Reports consuming less than two bottles of water daily She denies chest pain and visual disturbances She reports treatment adherence to metoprolol 100 mg BID Encouraged to increase her fluid intake  Encouraged to drink  64 ounces of water daily        Endocrine   Type 2 diabetes mellitus without complication ([place]) - Primary    She reports increased polyuria but denies polydipsia and polyphagia She denies urinary symptoms She reports not checking her glucose this morning but noted that her highest blood glucose is 113 and lowest is 68 She reports treatment adherence to metformin 500 mg daily Pending labs Refilled metformin 500 mg daily      Relevant Medications   metFORMIN (GLUCOPHAGE) 500 MG tablet     Other   Anemia   Other Visit Diagnoses     Refused influenza vaccine       Immunization  due       Relevant Orders   Varicella-zoster vaccine IM   IFG (impaired fasting glucose)       Relevant Orders   CBC with Differential/Platelet   CMP14+EGFR   TSH + free T4   Lipid Profile   Hemoglobin A1C   Vitamin D deficiency       Relevant  Orders   Vitamin D (25 hydroxy)       Meds ordered this encounter  Medications   metFORMIN (GLUCOPHAGE) 500 MG tablet    Sig: TAKE 1 TABLET(500 MG) BY MOUTH TWICE DAILY    Dispense:  60 tablet    Refill:  2    Follow-up: Return in about 3 months (around 07/03/2022).    Alvira Monday, FNP

## 2022-04-03 NOTE — Assessment & Plan Note (Addendum)
She reports increased polyuria but denies polydipsia and polyphagia She denies urinary symptoms She reports not checking her glucose this morning but noted that her highest blood glucose is 113 and lowest is 68 She reports treatment adherence to metformin 500 mg daily Pending labs Refilled metformin 500 mg daily

## 2022-04-04 LAB — CBC WITH DIFFERENTIAL/PLATELET
Basophils Absolute: 0.1 10*3/uL (ref 0.0–0.2)
Basos: 1 %
EOS (ABSOLUTE): 0.6 10*3/uL — ABNORMAL HIGH (ref 0.0–0.4)
Eos: 9 %
Hematocrit: 37.4 % (ref 34.0–46.6)
Hemoglobin: 11.9 g/dL (ref 11.1–15.9)
Immature Grans (Abs): 0 10*3/uL (ref 0.0–0.1)
Immature Granulocytes: 0 %
Lymphocytes Absolute: 2.4 10*3/uL (ref 0.7–3.1)
Lymphs: 33 %
MCH: 24.8 pg — ABNORMAL LOW (ref 26.6–33.0)
MCHC: 31.8 g/dL (ref 31.5–35.7)
MCV: 78 fL — ABNORMAL LOW (ref 79–97)
Monocytes Absolute: 0.6 10*3/uL (ref 0.1–0.9)
Monocytes: 8 %
Neutrophils Absolute: 3.6 10*3/uL (ref 1.4–7.0)
Neutrophils: 49 %
Platelets: 345 10*3/uL (ref 150–450)
RBC: 4.79 x10E6/uL (ref 3.77–5.28)
RDW: 15.5 % — ABNORMAL HIGH (ref 11.7–15.4)
WBC: 7.3 10*3/uL (ref 3.4–10.8)

## 2022-04-04 LAB — LIPID PANEL
Chol/HDL Ratio: 3.2 ratio (ref 0.0–4.4)
Cholesterol, Total: 100 mg/dL (ref 100–199)
HDL: 31 mg/dL — ABNORMAL LOW (ref >39)
LDL Chol Calc (NIH): 50 mg/dL (ref 0–99)
Triglycerides: 102 mg/dL (ref 0–149)
VLDL Cholesterol Cal: 19 mg/dL (ref 5–40)

## 2022-04-04 LAB — CMP14+EGFR
ALT: 23 IU/L (ref 0–32)
AST: 21 IU/L (ref 0–40)
Albumin/Globulin Ratio: 1.1 — ABNORMAL LOW (ref 1.2–2.2)
Albumin: 4.3 g/dL (ref 3.8–4.9)
Alkaline Phosphatase: 94 IU/L (ref 44–121)
BUN/Creatinine Ratio: 13 (ref 9–23)
BUN: 13 mg/dL (ref 6–24)
Bilirubin Total: 0.3 mg/dL (ref 0.0–1.2)
CO2: 25 mmol/L (ref 20–29)
Calcium: 9.8 mg/dL (ref 8.7–10.2)
Chloride: 102 mmol/L (ref 96–106)
Creatinine, Ser: 1.03 mg/dL — ABNORMAL HIGH (ref 0.57–1.00)
Globulin, Total: 4 g/dL (ref 1.5–4.5)
Glucose: 92 mg/dL (ref 70–99)
Potassium: 3.9 mmol/L (ref 3.5–5.2)
Sodium: 141 mmol/L (ref 134–144)
Total Protein: 8.3 g/dL (ref 6.0–8.5)
eGFR: 65 mL/min/{1.73_m2} (ref >59)

## 2022-04-04 LAB — TSH+FREE T4
Free T4: 1.01 ng/dL (ref 0.82–1.77)
TSH: 1.71 u[IU]/mL (ref 0.450–4.500)

## 2022-04-04 LAB — HEMOGLOBIN A1C
Est. average glucose Bld gHb Est-mCnc: 151 mg/dL
Hgb A1c MFr Bld: 6.9 % — ABNORMAL HIGH (ref 4.8–5.6)

## 2022-04-04 LAB — VITAMIN D 25 HYDROXY (VIT D DEFICIENCY, FRACTURES): Vit D, 25-Hydroxy: 18.8 ng/mL — ABNORMAL LOW (ref 30.0–100.0)

## 2022-04-11 ENCOUNTER — Ambulatory Visit: Payer: PRIVATE HEALTH INSURANCE | Admitting: Family Medicine

## 2022-04-11 ENCOUNTER — Other Ambulatory Visit: Payer: Self-pay | Admitting: Family Medicine

## 2022-04-11 DIAGNOSIS — E559 Vitamin D deficiency, unspecified: Secondary | ICD-10-CM

## 2022-04-11 MED ORDER — VITAMIN D (ERGOCALCIFEROL) 1.25 MG (50000 UNIT) PO CAPS: 50000.0000 [IU] | ORAL_CAPSULE | ORAL | 2 refills | Status: DC

## 2022-04-11 NOTE — Progress Notes (Signed)
Please inform the patient that metformin and vitamin D prescription has been sent to her pharmacy. She has diabetes, and her vit D is low. I recommend increasing her intake of iron-rich foods and fluids consumptions. I recommend increased physical activity. i also recommend a low-carb, fat, and sugar diet.

## 2022-04-14 ENCOUNTER — Other Ambulatory Visit: Payer: Self-pay | Admitting: Family Medicine

## 2022-04-14 DIAGNOSIS — I10 Essential (primary) hypertension: Secondary | ICD-10-CM

## 2022-04-17 ENCOUNTER — Ambulatory Visit (HOSPITAL_COMMUNITY): Payer: PRIVATE HEALTH INSURANCE

## 2022-04-28 ENCOUNTER — Other Ambulatory Visit: Payer: Self-pay | Admitting: Family Medicine

## 2022-04-28 DIAGNOSIS — I10 Essential (primary) hypertension: Secondary | ICD-10-CM

## 2022-05-19 ENCOUNTER — Ambulatory Visit: Payer: PRIVATE HEALTH INSURANCE | Admitting: Family Medicine

## 2022-06-01 ENCOUNTER — Ambulatory Visit: Payer: PRIVATE HEALTH INSURANCE | Admitting: Family Medicine

## 2022-06-02 ENCOUNTER — Ambulatory Visit: Payer: PRIVATE HEALTH INSURANCE | Admitting: Family Medicine

## 2022-06-21 ENCOUNTER — Encounter: Payer: Self-pay | Admitting: Family Medicine

## 2022-06-21 ENCOUNTER — Ambulatory Visit (INDEPENDENT_AMBULATORY_CARE_PROVIDER_SITE_OTHER): Payer: PRIVATE HEALTH INSURANCE | Admitting: Family Medicine

## 2022-06-21 ENCOUNTER — Other Ambulatory Visit: Payer: Self-pay | Admitting: Family Medicine

## 2022-06-21 VITALS — BP 188/108 | HR 85 | Resp 16 | Ht 67.0 in | Wt 254.1 lb

## 2022-06-21 DIAGNOSIS — E7849 Other hyperlipidemia: Secondary | ICD-10-CM | POA: Diagnosis not present

## 2022-06-21 DIAGNOSIS — R7301 Impaired fasting glucose: Secondary | ICD-10-CM

## 2022-06-21 DIAGNOSIS — I10 Essential (primary) hypertension: Secondary | ICD-10-CM

## 2022-06-21 DIAGNOSIS — E559 Vitamin D deficiency, unspecified: Secondary | ICD-10-CM

## 2022-06-21 DIAGNOSIS — E038 Other specified hypothyroidism: Secondary | ICD-10-CM | POA: Diagnosis not present

## 2022-06-21 MED ORDER — LOSARTAN POTASSIUM 25 MG PO TABS: 25.0000 mg | ORAL_TABLET | Freq: Every day | ORAL | 1 refills | Status: DC

## 2022-06-21 NOTE — Patient Instructions (Addendum)
I appreciate the opportunity to provide care to you today!    Follow up:  1 week  Labs: please stop by the lab today to get your blood drawn (CBC, CMP, TSH, Lipid profile, HgA1c, Vit D)  Please decrease your intake of high sodium food Please increase your physical activities, and your stress levels as this can elevate your blood pressure Please check your blood pressure at home daily and report blood pressure reading greater than 140/90 Please take metoprolol 100 mg twice daily as scheduled      Please continue to a heart-healthy diet and increase your physical activities. Try to exercise for 94mns at least three times a week.      It was a pleasure to see you and I look forward to continuing to work together on your health and well-being. Please do not hesitate to call the office if you need care or have questions about your care.   Have a wonderful day and week. With Gratitude, GAlvira MondayMSN, FNP-BC

## 2022-06-21 NOTE — Progress Notes (Signed)
Established Patient Office Visit  Subjective:  Patient ID: Laurie Brown, female    DOB: 30-Sep-1965  Age: 56 y.o. MRN: [id number]  CC:  Chief Complaint  Patient presents with  . Hypertension    Follow up visit     HPI Laurie Brown is a 56 y.o. female with past medical history of essential hypertension, presents for f/u and adnexal mass of  chronic medical conditions.  Hypertensive crisis: Patient is asymptomatic.  She reports taking metoprolol 100 mg twice daily, and noted that she did not take her medication today.  She denies chest pain, palpitation, decreased urine output, and visual disturbances.  She reports eating fried fatty foods with minimal physical activities.   HTN: frieds foods, zhanburgery, chicken,  Forgot to take medicine,   Past Medical History:  Diagnosis Date  . Diabetes mellitus without complication ([place])   . Hypertension     Past Surgical History:  Procedure Laterality Date  . TUBAL LIGATION    . TUBAL LIGATION      No family history on file.  Social History   Socioeconomic History  . Marital status: Divorced    Spouse name: Not on file  . Number of children: Not on file  . Years of education: Not on file  . Highest education level: Not on file  Occupational History  . Not on file  Tobacco Use  . Smoking status: Never  . Smokeless tobacco: Never  Vaping Use  . Vaping Use: Never used  Substance and Sexual Activity  . Alcohol use: No  . Drug use: No  . Sexual activity: Yes    Birth control/protection: Surgical  Other Topics Concern  . Not on file  Social History Narrative  . Not on file   Social Determinants of Health   Financial Resource Strain: Not on file  Food Insecurity: Not on file  Transportation Needs: Not on file  Physical Activity: Not on file  Stress: Not on file  Social Connections: Not on file  Intimate Partner Violence: Not on file    Outpatient Medications Prior to Visit  Medication Sig Dispense Refill  .  metFORMIN (GLUCOPHAGE) 500 MG tablet TAKE 1 TABLET(500 MG) BY MOUTH TWICE DAILY 60 tablet 2  . metoprolol tartrate (LOPRESSOR) 100 MG tablet TAKE 1 TABLET(100 MG) BY MOUTH TWICE DAILY 30 tablet 6  . rosuvastatin (CRESTOR) 10 MG tablet Take 1 tablet (10 mg total) by mouth daily. 90 tablet 3  . Vitamin D, Ergocalciferol, (DRISDOL) 1.25 MG (50000 UNIT) CAPS capsule Take 1 capsule (50,000 Units total) by mouth every 7 (seven) days. 5 capsule 2   No facility-administered medications prior to visit.    Allergies  Allergen Reactions  . Lisinopril Cough    ROS Review of Systems  Constitutional:  Negative for chills, fatigue and fever.  Eyes:  Negative for visual disturbance.  Respiratory:  Negative for choking, chest tightness and shortness of breath.   Cardiovascular:  Negative for chest pain and palpitations.  Neurological:  Negative for dizziness.      Objective:    Physical Exam  BP (!) 205/109   Pulse 85   Resp 16   Ht _0  (1.702 m)   Wt 254 lb 1.9 oz (115.3 kg)   LMP 05/06/2016   SpO2 96%   BMI 39.80 kg/m  Wt Readings from Last 3 Encounters:  06/21/22 254 lb 1.9 oz (115.3 kg)  04/03/22 253 lb 1.9 oz (114.8 kg)  12/22/21 251 lb 6.4 oz (114 kg)  Lab Results  Component Value Date   TSH 1.710 04/03/2022   Lab Results  Component Value Date   WBC 7.3 04/03/2022   HGB 11.9 04/03/2022   HCT 37.4 04/03/2022   MCV 78 (L) 04/03/2022   PLT 345 04/03/2022   Lab Results  Component Value Date   NA 141 04/03/2022   K 3.9 04/03/2022   CO2 25 04/03/2022   GLUCOSE 92 04/03/2022   BUN 13 04/03/2022   CREATININE 1.03 (H) 04/03/2022   BILITOT 0.3 04/03/2022   ALKPHOS 94 04/03/2022   AST 21 04/03/2022   ALT 23 04/03/2022   PROT 8.3 04/03/2022   ALBUMIN 4.3 04/03/2022   CALCIUM 9.8 04/03/2022   ANIONGAP 9 01/02/2020   EGFR 65 04/03/2022   Lab Results  Component Value Date   CHOL 100 04/03/2022   Lab Results  Component Value Date   HDL 31 (L) 04/03/2022   Lab  Results  Component Value Date   LDLCALC 50 04/03/2022   Lab Results  Component Value Date   TRIG 102 04/03/2022   Lab Results  Component Value Date   CHOLHDL 3.2 04/03/2022   Lab Results  Component Value Date   HGBA1C 6.9 (H) 04/03/2022      Assessment & Plan:  Primary hypertension -     EKG 12-Lead    Follow-up: No follow-ups on file.   Alvira Monday, FNP

## 2022-06-22 LAB — CMP14+EGFR
ALT: 31 IU/L (ref 0–32)
AST: 30 IU/L (ref 0–40)
Albumin/Globulin Ratio: 1.1 — ABNORMAL LOW (ref 1.2–2.2)
Albumin: 4.2 g/dL (ref 3.8–4.9)
Alkaline Phosphatase: 91 IU/L (ref 44–121)
BUN/Creatinine Ratio: 13 (ref 9–23)
BUN: 14 mg/dL (ref 6–24)
Bilirubin Total: 0.3 mg/dL (ref 0.0–1.2)
CO2: 24 mmol/L (ref 20–29)
Calcium: 9.9 mg/dL (ref 8.7–10.2)
Chloride: 105 mmol/L (ref 96–106)
Creatinine, Ser: 1.1 mg/dL — ABNORMAL HIGH (ref 0.57–1.00)
Globulin, Total: 3.9 g/dL (ref 1.5–4.5)
Glucose: 73 mg/dL (ref 70–99)
Potassium: 3.8 mmol/L (ref 3.5–5.2)
Sodium: 142 mmol/L (ref 134–144)
Total Protein: 8.1 g/dL (ref 6.0–8.5)
eGFR: 60 mL/min/{1.73_m2} (ref >59)

## 2022-06-22 LAB — CBC WITH DIFFERENTIAL/PLATELET
Basophils Absolute: 0.1 10*3/uL (ref 0.0–0.2)
Basos: 1 %
EOS (ABSOLUTE): 0.2 10*3/uL (ref 0.0–0.4)
Eos: 3 %
Hematocrit: 41 % (ref 34.0–46.6)
Hemoglobin: 12.6 g/dL (ref 11.1–15.9)
Immature Grans (Abs): 0 10*3/uL (ref 0.0–0.1)
Immature Granulocytes: 0 %
Lymphocytes Absolute: 2.1 10*3/uL (ref 0.7–3.1)
Lymphs: 33 %
MCH: 24 pg — ABNORMAL LOW (ref 26.6–33.0)
MCHC: 30.7 g/dL — ABNORMAL LOW (ref 31.5–35.7)
MCV: 78 fL — ABNORMAL LOW (ref 79–97)
Monocytes Absolute: 0.5 10*3/uL (ref 0.1–0.9)
Monocytes: 7 %
Neutrophils Absolute: 3.6 10*3/uL (ref 1.4–7.0)
Neutrophils: 56 %
Platelets: 374 10*3/uL (ref 150–450)
RBC: 5.25 x10E6/uL (ref 3.77–5.28)
RDW: 15 % (ref 11.7–15.4)
WBC: 6.5 10*3/uL (ref 3.4–10.8)

## 2022-06-22 LAB — LIPID PANEL
Chol/HDL Ratio: 6 ratio — ABNORMAL HIGH (ref 0.0–4.4)
Cholesterol, Total: 187 mg/dL (ref 100–199)
HDL: 31 mg/dL — ABNORMAL LOW (ref >39)
LDL Chol Calc (NIH): 128 mg/dL — ABNORMAL HIGH (ref 0–99)
Triglycerides: 153 mg/dL — ABNORMAL HIGH (ref 0–149)
VLDL Cholesterol Cal: 28 mg/dL (ref 5–40)

## 2022-06-22 LAB — TSH+FREE T4
Free T4: 1.14 ng/dL (ref 0.82–1.77)
TSH: 1.74 u[IU]/mL (ref 0.450–4.500)

## 2022-06-22 LAB — HEMOGLOBIN A1C
Est. average glucose Bld gHb Est-mCnc: 137 mg/dL
Hgb A1c MFr Bld: 6.4 % — ABNORMAL HIGH (ref 4.8–5.6)

## 2022-06-22 LAB — VITAMIN D 25 HYDROXY (VIT D DEFICIENCY, FRACTURES): Vit D, 25-Hydroxy: 16.3 ng/mL — ABNORMAL LOW (ref 30.0–100.0)

## 2022-06-22 NOTE — Assessment & Plan Note (Signed)
Hypertensive crisis She is asymptomatic, with no evidence of organ failure Encouraged to go to the ED but the patient declines EKG shows sinus rhythm with no evidence of infarction or ST elevation Will start the patient on losartan 50 mg today given that she is diabetic and has high blood pressure She informed me that she was started on [facility]oprolol [facility] mg [facility] for her blood pressure She reports minimal physical activity, increased stress, and poor eating habits Encouraged to start losartan 50 mg today, and to check her blood pressure daily until she returns for her follow up appointment Will follow up with the patient in the week

## 2022-06-25 ENCOUNTER — Other Ambulatory Visit: Payer: Self-pay | Admitting: Family Medicine

## 2022-06-25 DIAGNOSIS — E559 Vitamin D deficiency, unspecified: Secondary | ICD-10-CM

## 2022-06-25 DIAGNOSIS — E785 Hyperlipidemia, unspecified: Secondary | ICD-10-CM

## 2022-06-25 DIAGNOSIS — E119 Type 2 diabetes mellitus without complications: Secondary | ICD-10-CM

## 2022-06-25 MED ORDER — ROSUVASTATIN CALCIUM 20 MG PO TABS: 20.0000 mg | ORAL_TABLET | Freq: Every day | ORAL | 3 refills | Status: DC

## 2022-06-25 MED ORDER — VITAMIN D (ERGOCALCIFEROL) 1.25 MG (50000 UNIT) PO CAPS: 50000.0000 [IU] | ORAL_CAPSULE | ORAL | 2 refills | Status: DC

## 2022-06-25 MED ORDER — METFORMIN HCL 500 MG PO TABS: ORAL_TABLET | ORAL | 2 refills | Status: DC

## 2022-07-05 ENCOUNTER — Ambulatory Visit: Payer: PRIVATE HEALTH INSURANCE | Admitting: Family Medicine

## 2022-07-07 ENCOUNTER — Ambulatory Visit: Payer: PRIVATE HEALTH INSURANCE | Admitting: Family Medicine

## 2022-07-07 ENCOUNTER — Encounter: Payer: Self-pay | Admitting: Family Medicine

## 2022-07-20 ENCOUNTER — Ambulatory Visit: Payer: Medicaid Other | Admitting: Family Medicine

## 2022-08-07 ENCOUNTER — Ambulatory Visit (INDEPENDENT_AMBULATORY_CARE_PROVIDER_SITE_OTHER): Payer: Medicaid Other | Admitting: Family Medicine

## 2022-08-07 ENCOUNTER — Encounter: Payer: Self-pay | Admitting: Family Medicine

## 2022-08-07 VITALS — BP 126/70 | HR 83 | Ht 67.0 in | Wt 258.1 lb

## 2022-08-07 DIAGNOSIS — I10 Essential (primary) hypertension: Secondary | ICD-10-CM | POA: Diagnosis not present

## 2022-08-07 DIAGNOSIS — E119 Type 2 diabetes mellitus without complications: Secondary | ICD-10-CM

## 2022-08-07 DIAGNOSIS — Z1231 Encounter for screening mammogram for malignant neoplasm of breast: Secondary | ICD-10-CM

## 2022-08-07 MED ORDER — EMPAGLIFLOZIN 10 MG PO TABS: 10.0000 mg | ORAL_TABLET | Freq: Every day | ORAL | 2 refills | Status: DC

## 2022-08-07 NOTE — Addendum Note (Signed)
Addended by: Bolivar Haw on: 08/07/2022 11:32 AM   Modules accepted: Orders

## 2022-08-07 NOTE — Progress Notes (Signed)
Established Patient Office Visit  Subjective:  Patient ID: Laurie Brown, female    DOB: 04-25-1966  Age: 57 y.o. MRN: [id number]  CC:  Chief Complaint  Patient presents with   Hypertension    Pt reports doing well with bp readings.     HPI Laurie Brown is a 57 y.o. female with past medical history of primary hypertension, type 2 diabetes, and anemia presents for f/u of  chronic medical conditions. For the details of today's visit, please refer to the assessment and plan.     Past Medical History:  Diagnosis Date   Diabetes mellitus without complication ([place])    Hypertension     Past Surgical History:  Procedure Laterality Date   TUBAL LIGATION     TUBAL LIGATION      History reviewed. No pertinent family history.  Social History   Socioeconomic History   Marital status: Divorced    Spouse name: Not on file   Number of children: Not on file   Years of education: Not on file   Highest education level: Not on file  Occupational History   Not on file  Tobacco Use   Smoking status: Never   Smokeless tobacco: Never  Vaping Use   Vaping Use: Never used  Substance and Sexual Activity   Alcohol use: No   Drug use: No   Sexual activity: Yes    Birth control/protection: Surgical  Other Topics Concern   Not on file  Social History Narrative   Not on file   Social Determinants of Health   Financial Resource Strain: Not on file  Food Insecurity: Not on file  Transportation Needs: Not on file  Physical Activity: Not on file  Stress: Not on file  Social Connections: Not on file  Intimate Partner Violence: Not on file    Outpatient Medications Prior to Visit  Medication Sig Dispense Refill   losartan (COZAAR) 25 MG tablet Take 1 tablet (25 mg total) by mouth daily. 30 tablet 1   metoprolol tartrate (LOPRESSOR) 100 MG tablet TAKE 1 TABLET(100 MG) BY MOUTH TWICE DAILY 30 tablet 6   rosuvastatin (CRESTOR) 20 MG tablet Take 1 tablet (20 mg total) by mouth  daily. 90 tablet 3   Vitamin D, Ergocalciferol, (DRISDOL) 1.25 MG (50000 UNIT) CAPS capsule Take 1 capsule (50,000 Units total) by mouth every 7 (seven) days. 7 capsule 2   metFORMIN (GLUCOPHAGE) 500 MG tablet TAKE 1 TABLET(500 MG) BY MOUTH TWICE DAILY 60 tablet 2   No facility-administered medications prior to visit.    Allergies  Allergen Reactions   Lisinopril Cough    ROS Review of Systems  Constitutional:  Negative for chills and fever.  Eyes:  Negative for visual disturbance.  Respiratory:  Negative for chest tightness and shortness of breath.   Neurological:  Negative for dizziness and headaches.      Objective:    Physical Exam HENT:     Head: Normocephalic.     Mouth/Throat:     Mouth: Mucous membranes are moist.  Cardiovascular:     Rate and Rhythm: Normal rate.     Heart sounds: Normal heart sounds.  Pulmonary:     Effort: Pulmonary effort is normal.     Breath sounds: Normal breath sounds.  Neurological:     Mental Status: She is alert.     BP 126/70   Pulse 83   Ht '5\' 7"'$  (1.702 m)   Wt 258 lb 1.3 oz (117.1 kg)  LMP 05/06/2016   SpO2 97%   BMI 40.42 kg/m  Wt Readings from Last 3 Encounters:  08/07/22 258 lb 1.3 oz (117.1 kg)  06/21/22 254 lb 1.9 oz (115.3 kg)  04/03/22 253 lb 1.9 oz (114.8 kg)    Lab Results  Component Value Date   TSH 1.740 06/21/2022   Lab Results  Component Value Date   WBC 6.5 06/21/2022   HGB 12.6 06/21/2022   HCT 41.0 06/21/2022   MCV 78 (L) 06/21/2022   PLT 374 06/21/2022   Lab Results  Component Value Date   NA 142 06/21/2022   K 3.8 06/21/2022   CO2 24 06/21/2022   GLUCOSE 73 06/21/2022   BUN 14 06/21/2022   CREATININE 1.10 (H) 06/21/2022   BILITOT 0.3 06/21/2022   ALKPHOS 91 06/21/2022   AST 30 06/21/2022   ALT 31 06/21/2022   PROT 8.1 06/21/2022   ALBUMIN 4.2 06/21/2022   CALCIUM 9.9 06/21/2022   ANIONGAP 9 01/02/2020   EGFR 60 06/21/2022   Lab Results  Component Value Date   CHOL 187  06/21/2022   Lab Results  Component Value Date   HDL 31 (L) 06/21/2022   Lab Results  Component Value Date   LDLCALC 128 (H) 06/21/2022   Lab Results  Component Value Date   TRIG 153 (H) 06/21/2022   Lab Results  Component Value Date   CHOLHDL 6.0 (H) 06/21/2022   Lab Results  Component Value Date   HGBA1C 6.4 (H) 06/21/2022      Assessment & Plan:  Primary hypertension Assessment & Plan: Controlled She takes losartan 50 mg daily and metoprolol 100 mg twice daily Patient is asymptomatic Encourage low-sodium diet with increased physical activity BP Readings from Last 3 Encounters:  08/07/22 126/70  06/21/22 (!) 188/108  04/03/22 132/72      Type 2 diabetes mellitus without complication, unspecified whether long term insulin use ([facility]) Assessment & Plan: She takes metformin 500 mg daily She reports GI side effects of metformin and would like to be started on Jardiance DC metformin 500 mg daily Patient started on Jardiance 10 mg daily Encouraged to decrease her intake of high sugar foods with increased physical activity We will follow-up on hemoglobin A1c in 3 months Lab Results  Component Value Date   HGBA1C 6.4 (H) 06/21/2022     Orders: -     Microalbumin / creatinine urine ratio -     Empagliflozin; Take 1 tablet (10 mg total) by mouth daily before breakfast.  Dispense: 30 tablet; Refill: 2  Breast cancer screening by mammogram -     3D Screening Mammogram, Left and Right    Follow-up: Return in about 3 months (around 11/06/2022).   Laurie Monday, FNP

## 2022-08-07 NOTE — Assessment & Plan Note (Signed)
Controlled She takes losartan 50 mg daily and metoprolol 100 mg twice daily Patient is asymptomatic Encourage low-sodium diet with increased physical activity BP Readings from Last 3 Encounters:  08/07/22 126/70  06/21/22 (!) 188/108  04/03/22 132/72

## 2022-08-07 NOTE — Patient Instructions (Addendum)
  I appreciate the opportunity to provide care to you today!    Follow up:  3 months  Labs:next visit  Please pick up your medication at the pharmacy and start therapy Your blood pressure is well-controlled with losartan 50 mg daily and metoprolol 100 mg twice daily Please continue on therapy After discontinuing metformin 500 mg and started on Jardiance 10 mg daily I recommend decreasing your intake of high sugar foods with increased physical activities   Please schedule Mammogram    Please continue to a heart-healthy diet and increase your physical activities. Try to exercise for 95mns at least five times a week.      It was a pleasure to see you and I look forward to continuing to work together on your health and well-being. Please do not hesitate to call the office if you need care or have questions about your care.   Have a wonderful day and week. With Gratitude, GAlvira MondayMSN, FNP-BC

## 2022-08-07 NOTE — Assessment & Plan Note (Signed)
She takes metformin 500 mg daily She reports GI side effects of metformin and would like to be started on Jardiance DC metformin 500 mg daily Patient started on Jardiance 10 mg daily Encouraged to decrease her intake of high sugar foods with increased physical activity We will follow-up on hemoglobin A1c in 3 months Lab Results  Component Value Date   HGBA1C 6.4 (H) 06/21/2022

## 2022-08-18 ENCOUNTER — Other Ambulatory Visit: Payer: Self-pay | Admitting: Family Medicine

## 2022-08-18 DIAGNOSIS — I10 Essential (primary) hypertension: Secondary | ICD-10-CM

## 2022-08-21 ENCOUNTER — Other Ambulatory Visit: Payer: Self-pay | Admitting: Family Medicine

## 2022-08-21 DIAGNOSIS — I10 Essential (primary) hypertension: Secondary | ICD-10-CM

## 2022-09-04 ENCOUNTER — Other Ambulatory Visit (HOSPITAL_COMMUNITY): Payer: Self-pay | Admitting: Family Medicine

## 2022-09-04 DIAGNOSIS — Z1231 Encounter for screening mammogram for malignant neoplasm of breast: Secondary | ICD-10-CM

## 2022-09-06 ENCOUNTER — Inpatient Hospital Stay (HOSPITAL_COMMUNITY): Admission: RE | Admit: 2022-09-06 | Payer: Medicaid Other | Source: Ambulatory Visit

## 2022-09-06 DIAGNOSIS — Z1231 Encounter for screening mammogram for malignant neoplasm of breast: Secondary | ICD-10-CM

## 2022-09-15 ENCOUNTER — Other Ambulatory Visit: Payer: Self-pay | Admitting: Family Medicine

## 2022-09-15 DIAGNOSIS — I10 Essential (primary) hypertension: Secondary | ICD-10-CM

## 2022-10-26 ENCOUNTER — Other Ambulatory Visit: Payer: Self-pay

## 2022-10-26 ENCOUNTER — Telehealth: Payer: Self-pay | Admitting: Family Medicine

## 2022-10-26 DIAGNOSIS — I10 Essential (primary) hypertension: Secondary | ICD-10-CM

## 2022-10-26 MED ORDER — METOPROLOL TARTRATE 100 MG PO TABS: ORAL_TABLET | ORAL | 6 refills | Status: DC

## 2022-10-26 NOTE — Telephone Encounter (Signed)
Refills sent to pharmacy. 

## 2022-10-26 NOTE — Telephone Encounter (Signed)
Prescription Request  10/26/2022  LOV: 08/07/2022  What is the name of the medication or equipment? metoprolol tartrate (LOPRESSOR) 100 MG tablet [[id number]]   Have you contacted your pharmacy to request a refill? Yes   Which pharmacy would you like this sent to?  [id number] - [place], [place] - [address] [place] [address] Phone: [phone] Fax: [phone]    Patient notified that their request is being sent to the clinical staff for review and that they should receive a response within 2 business days.   Please advise at Mobile [phone] (mobile)

## 2022-11-02 ENCOUNTER — Encounter: Payer: Self-pay | Admitting: Family Medicine

## 2022-11-02 ENCOUNTER — Ambulatory Visit (INDEPENDENT_AMBULATORY_CARE_PROVIDER_SITE_OTHER): Payer: Medicaid Other | Admitting: Family Medicine

## 2022-11-02 VITALS — BP 138/86 | HR 76 | Ht 67.0 in | Wt 252.1 lb

## 2022-11-02 DIAGNOSIS — E559 Vitamin D deficiency, unspecified: Secondary | ICD-10-CM

## 2022-11-02 DIAGNOSIS — E119 Type 2 diabetes mellitus without complications: Secondary | ICD-10-CM

## 2022-11-02 DIAGNOSIS — I10 Essential (primary) hypertension: Secondary | ICD-10-CM

## 2022-11-02 DIAGNOSIS — Z23 Encounter for immunization: Secondary | ICD-10-CM

## 2022-11-02 DIAGNOSIS — E1169 Type 2 diabetes mellitus with other specified complication: Secondary | ICD-10-CM | POA: Diagnosis not present

## 2022-11-02 DIAGNOSIS — E0789 Other specified disorders of thyroid: Secondary | ICD-10-CM

## 2022-11-02 DIAGNOSIS — E7849 Other hyperlipidemia: Secondary | ICD-10-CM

## 2022-11-02 DIAGNOSIS — E785 Hyperlipidemia, unspecified: Secondary | ICD-10-CM

## 2022-11-02 NOTE — Progress Notes (Signed)
Established Patient Office Visit  Subjective:  Patient ID: Laurie Brown, female    DOB: 10-21-1965  Age: 57 y.o. MRN: [id number]  CC:  Chief Complaint  Patient presents with   Follow-up    3 month f/u    HPI Laurie Brown is a 57 y.o. female with past medical history of hypertension and type 2 diabetes presents for f/u of  chronic medical conditions. For the details of today's visit, please refer to the assessment and plan.     Past Medical History:  Diagnosis Date   Diabetes mellitus without complication    Hypertension     Past Surgical History:  Procedure Laterality Date   TUBAL LIGATION     TUBAL LIGATION      History reviewed. No pertinent family history.  Social History   Socioeconomic History   Marital status: Divorced    Spouse name: Not on file   Number of children: Not on file   Years of education: Not on file   Highest education level: Not on file  Occupational History   Not on file  Tobacco Use   Smoking status: Never   Smokeless tobacco: Never  Vaping Use   Vaping Use: Never used  Substance and Sexual Activity   Alcohol use: No   Drug use: No   Sexual activity: Yes    Birth control/protection: Surgical  Other Topics Concern   Not on file  Social History Narrative   Not on file   Social Determinants of Health   Financial Resource Strain: Not on file  Food Insecurity: Not on file  Transportation Needs: Not on file  Physical Activity: Not on file  Stress: Not on file  Social Connections: Not on file  Intimate Partner Violence: Not on file    Outpatient Medications Prior to Visit  Medication Sig Dispense Refill   empagliflozin (JARDIANCE) 10 MG TABS tablet Take 1 tablet (10 mg total) by mouth daily before breakfast. 30 tablet 2   metoprolol tartrate (LOPRESSOR) 100 MG tablet TAKE 1 TABLET(100 MG) BY MOUTH TWICE DAILY 30 tablet 6   rosuvastatin (CRESTOR) 20 MG tablet Take 1 tablet (20 mg total) by mouth daily. 90 tablet 3   Vitamin  D, Ergocalciferol, (DRISDOL) 1.25 MG (50000 UNIT) CAPS capsule Take 1 capsule (50,000 Units total) by mouth every 7 (seven) days. 7 capsule 2   losartan (COZAAR) 25 MG tablet TAKE 1 TABLET(25 MG) BY MOUTH DAILY 90 tablet 0   No facility-administered medications prior to visit.    Allergies  Allergen Reactions   Lisinopril Cough    ROS Review of Systems  Constitutional:  Negative for chills and fever.  Eyes:  Negative for visual disturbance.  Respiratory:  Negative for chest tightness and shortness of breath.   Neurological:  Negative for dizziness and headaches.      Objective:    Physical Exam HENT:     Head: Normocephalic.     Mouth/Throat:     Mouth: Mucous membranes are moist.  Cardiovascular:     Rate and Rhythm: Normal rate.     Heart sounds: Normal heart sounds.  Pulmonary:     Effort: Pulmonary effort is normal.     Breath sounds: Normal breath sounds.  Neurological:     Mental Status: She is alert.     BP 138/86   Pulse 76   Ht  (1.702 m)   Wt 252 lb 1.9 oz (114.4 kg)   LMP 05/06/2016   SpO2 95%  BMI 39.49 kg/m  Wt Readings from Last 3 Encounters:  11/02/22 252 lb 1.9 oz (114.4 kg)  08/07/22 258 lb 1.3 oz (117.1 kg)  06/21/22 254 lb 1.9 oz (115.3 kg)    Lab Results  Component Value Date   TSH 1.740 06/21/2022   Lab Results  Component Value Date   WBC 6.5 06/21/2022   HGB 12.6 06/21/2022   HCT 41.0 06/21/2022   MCV 78 (L) 06/21/2022   PLT 374 06/21/2022   Lab Results  Component Value Date   NA 142 06/21/2022   K 3.8 06/21/2022   CO2 24 06/21/2022   GLUCOSE 73 06/21/2022   BUN 14 06/21/2022   CREATININE 1.10 (H) 06/21/2022   BILITOT 0.3 06/21/2022   ALKPHOS 91 06/21/2022   AST 30 06/21/2022   ALT 31 06/21/2022   PROT 8.1 06/21/2022   ALBUMIN 4.2 06/21/2022   CALCIUM 9.9 06/21/2022   ANIONGAP 9 01/02/2020   EGFR 60 06/21/2022   Lab Results  Component Value Date   CHOL 187 06/21/2022   Lab Results  Component Value Date    HDL 31 (L) 06/21/2022   Lab Results  Component Value Date   LDLCALC 128 (H) 06/21/2022   Lab Results  Component Value Date   TRIG 153 (H) 06/21/2022   Lab Results  Component Value Date   CHOLHDL 6.0 (H) 06/21/2022   Lab Results  Component Value Date   HGBA1C 6.4 (H) 06/21/2022      Assessment & Plan:  Primary hypertension Assessment & Plan: Controlled She takes losartan 50 mg daily and metoprolol 100 mg twice daily Denies headaches, dizziness, blurred vision, chest pain, palpitation Reports compliance with treatment regimen. Low-sodium diet and increase physical activity encouraged BP Readings from Last 3 Encounters:  11/02/22 138/86  08/07/22 126/70  06/21/22 (!) 188/108     Orders: -     CBC with Differential/Platelet -     CMP14+EGFR  Type 2 diabetes mellitus without complication, unspecified whether long term insulin use Assessment & Plan: Denies polyuria, polyphagia, dyspnea Currently taking Jardiance 10 mg daily Pending hemoglobin A1c today Lab Results  Component Value Date   HGBA1C 6.4 (H) 06/21/2022     Orders: -     Hemoglobin A1c -     Microalbumin / creatinine urine ratio  Hyperlipidemia associated with type 2 diabetes mellitus Assessment & Plan: She takes rosuvastatin 20 mg daily Denies muscle aches and pain Pending lipid panel today Lab Results  Component Value Date   CHOL 187 06/21/2022   HDL 31 (L) 06/21/2022   LDLCALC 128 (H) 06/21/2022   TRIG 153 (H) 06/21/2022   CHOLHDL 6.0 (H) 06/21/2022      Other hyperlipidemia -     Lipid panel  Immunization due -     Varicella-zoster vaccine IM  Vitamin D deficiency -     VITAMIN D 25 Hydroxy (Vit-D Deficiency, Fractures)  Other specified disorders of thyroid -     TSH + free T4    Follow-up: Return in about 3 months (around 02/01/2023).   Gilmore Laroche, FNP

## 2022-11-02 NOTE — Patient Instructions (Addendum)
I appreciate the opportunity to provide care to you today!    Follow up:  3 months  Labs: please stop by the lab today to get your blood drawn (CBC, CMP, TSH, Lipid profile, HgA1c, Vit D)  Please schedule Mammogram   Please continue to a heart-healthy diet and increase your physical activities. Try to exercise for at least five days a week.      It was a pleasure to see you and I look forward to continuing to work together on your health and well-being. Please do not hesitate to call the office if you need care or have questions about your care.   Have a wonderful day and week. With Gratitude, Gilmore Laroche MSN, FNP-BC

## 2022-11-02 NOTE — Assessment & Plan Note (Signed)
She takes rosuvastatin 20 mg daily Denies muscle aches and pain Pending lipid panel today Lab Results  Component Value Date   CHOL 187 06/21/2022   HDL 31 (L) 06/21/2022   LDLCALC 128 (H) 06/21/2022   TRIG 153 (H) 06/21/2022   CHOLHDL 6.0 (H) 06/21/2022

## 2022-11-02 NOTE — Progress Notes (Signed)
foll

## 2022-11-02 NOTE — Assessment & Plan Note (Signed)
Denies polyuria, polyphagia, dyspnea Currently taking Jardiance 10 mg daily Pending hemoglobin A1c today Lab Results  Component Value Date   HGBA1C 6.4 (H) 06/21/2022

## 2022-11-02 NOTE — Assessment & Plan Note (Signed)
Controlled She takes losartan 50 mg daily and metoprolol 100 mg twice daily Denies headaches, dizziness, blurred vision, chest pain, palpitation Reports compliance with treatment regimen. Low-sodium diet and increase physical activity encouraged BP Readings from Last 3 Encounters:  11/02/22 138/86  08/07/22 126/70  06/21/22 (!) 188/108

## 2022-11-08 ENCOUNTER — Ambulatory Visit: Payer: Medicaid Other | Admitting: Family Medicine

## 2022-11-13 ENCOUNTER — Ambulatory Visit (HOSPITAL_COMMUNITY): Payer: Medicaid Other

## 2022-11-22 ENCOUNTER — Other Ambulatory Visit: Payer: Self-pay

## 2022-11-22 ENCOUNTER — Emergency Department (HOSPITAL_COMMUNITY)
Admission: EM | Admit: 2022-11-22 | Discharge: 2022-11-22 | Disposition: A | Discharge status: Home / Self Care | Payer: Medicaid Other | Attending: Emergency Medicine | Admitting: Emergency Medicine

## 2022-11-22 ENCOUNTER — Encounter (HOSPITAL_COMMUNITY): Payer: Self-pay

## 2022-11-22 ENCOUNTER — Other Ambulatory Visit: Payer: Self-pay | Admitting: Family Medicine

## 2022-11-22 DIAGNOSIS — I10 Essential (primary) hypertension: Secondary | ICD-10-CM | POA: Insufficient documentation

## 2022-11-22 DIAGNOSIS — J020 Streptococcal pharyngitis: Secondary | ICD-10-CM | POA: Insufficient documentation

## 2022-11-22 DIAGNOSIS — J029 Acute pharyngitis, unspecified: Secondary | ICD-10-CM | POA: Diagnosis present

## 2022-11-22 DIAGNOSIS — E119 Type 2 diabetes mellitus without complications: Secondary | ICD-10-CM | POA: Insufficient documentation

## 2022-11-22 DIAGNOSIS — Z7984 Long term (current) use of oral hypoglycemic drugs: Secondary | ICD-10-CM | POA: Diagnosis not present

## 2022-11-22 DIAGNOSIS — Z20822 Contact with and (suspected) exposure to covid-19: Secondary | ICD-10-CM | POA: Diagnosis not present

## 2022-11-22 DIAGNOSIS — Z79899 Other long term (current) drug therapy: Secondary | ICD-10-CM | POA: Diagnosis not present

## 2022-11-22 LAB — SARS CORONAVIRUS 2 BY RT PCR: SARS Coronavirus 2 by RT PCR: NEGATIVE

## 2022-11-22 LAB — GROUP A STREP BY PCR: Group A Strep by PCR: DETECTED — AB

## 2022-11-22 MED ORDER — AMOXICILLIN 875 MG PO TABS: 875.0000 mg | ORAL_TABLET | Freq: Two times a day (BID) | ORAL | 0 refills | Status: DC

## 2022-11-22 MED ORDER — LIDOCAINE VISCOUS HCL 2 % MT SOLN
15.0000 mL | Freq: Once | OROMUCOSAL | Status: AC
  Administered 2022-11-22: 15 mL via OROMUCOSAL
  Filled 2022-11-22: qty 15

## 2022-11-22 MED ORDER — IBUPROFEN 800 MG PO TABS
800.0000 mg | ORAL_TABLET | Freq: Once | ORAL | Status: AC
  Administered 2022-11-22: 800 mg via ORAL
  Filled 2022-11-22: qty 1

## 2022-11-22 MED ORDER — IBUPROFEN 800 MG PO TABS: 800.0000 mg | ORAL_TABLET | Freq: Three times a day (TID) | ORAL | 0 refills | Status: DC | PRN

## 2022-11-22 MED ORDER — DEXAMETHASONE 4 MG PO TABS
10.0000 mg | ORAL_TABLET | Freq: Once | ORAL | Status: AC
  Administered 2022-11-22: 10 mg via ORAL
  Filled 2022-11-22: qty 3

## 2022-11-22 NOTE — Discharge Instructions (Signed)
The workup today was overall consistent with strep throat.  As discussed, we will treat with antibiotics outpatient.  Recommend Tylenol/Motrin as needed for pain/fever as well as Cepacol throat lozenges.  Recommend follow-up with primary care for reassessment of your symptoms.  Please do not hesitate to return to emergency department for worrisome signs and symptoms we discussed become apparent.

## 2022-11-22 NOTE — ED Provider Notes (Signed)
[place] EMERGENCY DEPARTMENT AT [facility] Provider Note   CSN: [id number] Arrival date & time: 11/22/22  0623     History  Chief Complaint  Patient presents with   Sore Throat    Laurie Brown is a 57 y.o. female.   Sore Throat   57 year old female presents emergency department with complaints of sore throat.  Patient states she has been with sore throat for the past 2 days.  She noted fever this morning of 100.3 of which she did not take antipyretic.  Reports pain with swallowing but no difficulty swallowing, breathing.  Denies any dental pain.  Denies any known sick exposure.  Also reports intermittent right ear pain for the past few days but states it is currently hurting.  Denies chest pain, shortness of breath, cough, abdominal pain, nausea, vomiting, urinary symptoms, change in bowel habits.  Past medical history significant for diabetes mellitus type 2, hypertension, hyperlipidemia, obesity  Home Medications Prior to Admission medications   Medication Sig Start Date End Date Taking? Authorizing Provider  amoxicillin (AMOXIL) 875 MG tablet Take 1 tablet (875 mg total) by mouth 2 (two) times daily. 11/22/22  Yes Sherian Maroon A, PA  empagliflozin (JARDIANCE) 10 MG TABS tablet Take 1 tablet (10 mg total) by mouth daily before breakfast. 08/07/22  Yes Gilmore Laroche, FNP  ibuprofen (ADVIL) 800 MG tablet Take 1 tablet (800 mg total) by mouth every 8 (eight) hours as needed for fever, moderate pain or mild pain. 11/22/22  Yes Sherian Maroon A, PA  losartan (COZAAR) 25 MG tablet TAKE 1 TABLET(25 MG) BY MOUTH DAILY 09/15/22  Yes Gilmore Laroche, FNP  metoprolol tartrate (LOPRESSOR) 100 MG tablet TAKE 1 TABLET(100 MG) BY MOUTH TWICE DAILY 10/26/22  Yes Gilmore Laroche, FNP  rosuvastatin (CRESTOR) 10 MG tablet Take 10 mg by mouth at bedtime. 09/18/22  Yes [provider]  Vitamin D, Ergocalciferol, (DRISDOL) 1.25 MG (50000 UNIT) CAPS capsule Take 1 capsule (50,000  Units total) by mouth every 7 (seven) days. Patient not taking: Reported on 11/22/2022 06/25/22   Gilmore Laroche, FNP      Allergies    Lisinopril    Review of Systems   Review of Systems  All other systems reviewed and are negative.   Physical Exam Updated Vital Signs BP (!) 201/105 (BP Location: Left Arm)   Pulse 88   Temp 100.3 F (37.9 C) (Oral)   Ht 5\' 7"  (1.702 m)   Wt 114.8 kg   LMP 05/06/2016   SpO2 97%   BMI 39.63 kg/m  Physical Exam Vitals and nursing note reviewed.  Constitutional:      General: She is not in acute distress.    Appearance: She is well-developed.  HENT:     Head: Normocephalic and atraumatic.     Left Ear: Tympanic membrane, ear canal and external ear normal. There is no impacted cerumen.     Ears:     Comments: Mildly erythematous right-sided TM.  Unable to visualize entirety of TM secondary to cerumen in external auditory canal.  No obvious maceration or drainage otherwise.    Mouth/Throat:     Comments: Patient with moderate posterior pharyngeal erythema.  Uvula midline rise symmetric with phonation.  Tonsils 1-2+ bilaterally with minimal exudate.  No sublingual or submandibular swelling.  No auscultatory stridor.  Patient tolerating oral secretions without difficulty.  No evidence of trismus. Eyes:     Conjunctiva/sclera: Conjunctivae normal.  Cardiovascular:     Rate and Rhythm: Normal  rate and regular rhythm.     Heart sounds: No murmur heard. Pulmonary:     Effort: Pulmonary effort is normal. No respiratory distress.     Breath sounds: Normal breath sounds. No wheezing, rhonchi or rales.  Abdominal:     Palpations: Abdomen is soft.     Tenderness: There is no abdominal tenderness.  Musculoskeletal:        General: No swelling.     Cervical back: Neck supple. No rigidity or tenderness.     Right lower leg: No edema.     Left lower leg: No edema.  Skin:    General: Skin is warm and dry.     Capillary Refill: Capillary refill takes  less than 2 seconds.  Neurological:     Mental Status: She is alert.  Psychiatric:        Mood and Affect: Mood normal.     ED Results / Procedures / Treatments   Labs (all labs ordered are listed, but only abnormal results are displayed) Labs Reviewed  GROUP A STREP BY PCR - Abnormal; Notable for the following components:      Result Value   Group A Strep by PCR DETECTED (*)    All other components within normal limits  SARS CORONAVIRUS 2 BY RT PCR    EKG None  Radiology No results found.  Procedures Procedures    Medications Ordered in ED Medications  dexamethasone (DECADRON) tablet 10 mg (10 mg Oral Given 11/22/22 0937)  ibuprofen (ADVIL) tablet 800 mg (800 mg Oral Given 11/22/22 0937)  lidocaine (XYLOCAINE) 2 % viscous mouth solution 15 mL (15 mLs Mouth/Throat Given 11/22/22 1610)    ED Course/ Medical Decision Making/ A&P                             Medical Decision Making Risk Prescription drug management.   This patient presents to the ED for concern of sore throat, this involves an extensive number of treatment options, and is a complaint that carries with it a high risk of complications and morbidity.  The differential diagnosis includes pharyngitis, strep pharyngitis, Lemierre's disease, Ludwig angina, peritonsillar abscess, periapical abscess, necrotizing ulcerative gingivitis   Co morbidities that complicate the patient evaluation  See HPI   Additional history obtained:  Additional history obtained from EMR External records from outside source obtained and reviewed including see above   Lab Tests:  I Ordered, and personally interpreted labs.  The pertinent results include: Group A strep positive.  Coronavirus negative   Imaging Studies ordered:  N/a   Cardiac Monitoring: / EKG:  The patient was maintained on a cardiac monitor.  I personally viewed and interpreted the cardiac monitored which showed an underlying rhythm of: Sinus  rhythm   Consultations Obtained:  N/a   Problem List / ED Course / Critical interventions / Medication management  Sore throat, right ear pain I ordered medication including cane, Decadron, Advil  Reevaluation of the patient after these medicines showed that the patient improved I have reviewed the patients home medicines and have made adjustments as needed   Social Determinants of Health:  Denies tobacco, illicit drug use   Test / Admission - Considered:  Sore throat/strep pharyngitis Vitals signs significant for fever with oral temp of 100.3 as well as hypertensive with blood pressure of 201/1 050 which decreased with time elapsed and medicines administered on emergency department. Otherwise within normal range and stable throughout visit.  Laboratory studies significant for: See above 57 year old female presents emergency department with complaints of strep throat.  Patient with evidence of strep pharyngitis as indicated on laboratory studies.  No clinical indication for Ludwig angina, peritonsillar abscess.  Will treat with antibiotics outpatient and recommend Tylenol/Motrin as needed for pain/fever as well as Cepacol throat lozenges to help soothe throat.  Patient recommended follow-up with primary care for reassessment of symptoms as well as for reevaluation of blood pressure.  Patient overall well-appearing, tolerating p.o. without difficulty.  Treatment plan discussed at length with patient and she acknowledged understanding was agreeable to said plan. Worrisome signs and symptoms were discussed with the patient, and the patient acknowledged understanding to return to the ED if noticed. Patient was stable upon discharge.        Final Clinical Impression(s) / ED Diagnoses Final diagnoses:  Strep pharyngitis    Rx / DC Orders ED Discharge Orders          Ordered    ibuprofen (ADVIL) 800 MG tablet  Every 8 hours PRN        11/22/22 1017    amoxicillin (AMOXIL) 875 MG  tablet  2 times daily        11/22/22 1017              Peter Garter, [place] 11/22/22 1020    Pricilla Loveless, MD 11/24/22 [phone]

## 2022-11-22 NOTE — ED Notes (Signed)
Pt. Discharged at 1245 with pain rating at 2 out of 10

## 2022-11-22 NOTE — ED Triage Notes (Signed)
Pt. In today complaining of a sore throat and fever (temp: 100.3 oral).

## 2022-11-23 ENCOUNTER — Telehealth: Payer: Self-pay

## 2022-11-23 NOTE — Transitions of Care (Post Inpatient/ED Visit) (Signed)
   11/23/2022  Name: Laurie Brown MRN: [id number] DOB: [birth year]  Today's TOC FU Call Status: Today's TOC FU Call Status:: Successful TOC FU Call Competed TOC FU Call Complete Date: 11/23/22  Transition Care Management Follow-up Telephone Call Date of Discharge: 11/22/22 [facility] Type of Discharge: Emergency Department Reason for ED Visit: Other: (strep pharyngitis) How have you been since you were released from the hospital?: Better Any questions or concerns?: No  Items Reviewed: Did you receive and understand the discharge instructions provided?: Yes Medications obtained,verified, and reconciled?: Yes (Medications Reviewed) Any new allergies since your discharge?: No Dietary orders reviewed?: NA Do you have support at home?: Yes  Medications Reviewed Today: Medications Reviewed Today     Reviewed by Barb Merino, LPN (Licensed Practical Nurse) on 11/23/22 at 1219  Med List Status: <None>   Medication Order Taking? Sig Documenting Provider Last Dose Status Informant  amoxicillin (AMOXIL) 875 MG tablet [id number]  Take 1 tablet (875 mg total) by mouth 2 (two) times daily. Sherian Maroon A, [place]  Active   ibuprofen (ADVIL) 800 MG tablet [id number]  Take 1 tablet (800 mg total) by mouth every 8 (eight) hours as needed for fever, moderate pain or mild pain. Peter Garter, [place]  Active   JARDIANCE 10 MG TABS tablet [id number]  TAKE 1 TABLET(10 MG) BY MOUTH DAILY BEFORE Ella Bodo, FNP  Active   losartan (COZAAR) 25 MG tablet [id number] No TAKE 1 TABLET(25 MG) BY MOUTH DAILY Gilmore Laroche, FNP 11/22/2022 Active Self  metoprolol tartrate (LOPRESSOR) 100 MG tablet [id number] No TAKE 1 TABLET(100 MG) BY MOUTH TWICE DAILY Gilmore Laroche, FNP 11/22/2022 Active Self  rosuvastatin (CRESTOR) 10 MG tablet [id number] No Take 10 mg by mouth at bedtime. [provider] 11/22/2022 Active Self, Pharmacy Records  Vitamin D, Ergocalciferol, (DRISDOL) 1.25  MG (50000 UNIT) CAPS capsule [id number] No Take 1 capsule (50,000 Units total) by mouth every 7 (seven) days.  Patient not taking: Reported on 11/22/2022   Gilmore Laroche, FNP Not Taking Active Self            Home Care and Equipment/Supplies: Were Home Health Services Ordered?: NA Any new equipment or medical supplies ordered?: NA  Functional Questionnaire: Do you need assistance with bathing/showering or dressing?: No Do you need assistance with meal preparation?: No Do you need assistance with eating?: No Do you have difficulty maintaining continence: No Do you need assistance with getting out of bed/getting out of a chair/moving?: No Do you have difficulty managing or taking your medications?: No  Follow up appointments reviewed: PCP Follow-up appointment confirmed?: No (states she will call if necessary after finishing her treatment) Do you understand care options if your condition(s) worsen?: Yes-patient verbalized understanding    SIGNATURE Elisha Ponder LPN [facility] AWV Team Direct dial:  [phone]

## 2022-12-16 ENCOUNTER — Other Ambulatory Visit: Payer: Self-pay | Admitting: Family Medicine

## 2022-12-28 ENCOUNTER — Other Ambulatory Visit: Payer: Self-pay | Admitting: Family Medicine

## 2022-12-28 DIAGNOSIS — I10 Essential (primary) hypertension: Secondary | ICD-10-CM

## 2023-02-01 ENCOUNTER — Encounter: Payer: Self-pay | Admitting: Family Medicine

## 2023-02-01 ENCOUNTER — Ambulatory Visit (INDEPENDENT_AMBULATORY_CARE_PROVIDER_SITE_OTHER): Payer: MEDICAID | Admitting: Family Medicine

## 2023-02-01 VITALS — BP 158/90 | HR 80 | Ht 67.0 in | Wt 246.0 lb

## 2023-02-01 DIAGNOSIS — E559 Vitamin D deficiency, unspecified: Secondary | ICD-10-CM

## 2023-02-01 DIAGNOSIS — B349 Viral infection, unspecified: Secondary | ICD-10-CM | POA: Diagnosis not present

## 2023-02-01 DIAGNOSIS — R7301 Impaired fasting glucose: Secondary | ICD-10-CM

## 2023-02-01 DIAGNOSIS — I10 Essential (primary) hypertension: Secondary | ICD-10-CM

## 2023-02-01 DIAGNOSIS — J029 Acute pharyngitis, unspecified: Secondary | ICD-10-CM | POA: Diagnosis not present

## 2023-02-01 DIAGNOSIS — E7849 Other hyperlipidemia: Secondary | ICD-10-CM

## 2023-02-01 DIAGNOSIS — E119 Type 2 diabetes mellitus without complications: Secondary | ICD-10-CM

## 2023-02-01 DIAGNOSIS — E038 Other specified hypothyroidism: Secondary | ICD-10-CM

## 2023-02-01 DIAGNOSIS — H6123 Impacted cerumen, bilateral: Secondary | ICD-10-CM

## 2023-02-01 LAB — POCT RAPID STREP A (OFFICE): Rapid Strep A Screen: NEGATIVE

## 2023-02-01 MED ORDER — PROMETHAZINE-DM 6.25-15 MG/5ML PO SYRP
5.0000 mL | ORAL_SOLUTION | Freq: Four times a day (QID) | ORAL | 0 refills | Status: DC | PRN
Start: 2023-02-01 — End: 2023-03-23

## 2023-02-01 NOTE — Progress Notes (Signed)
Established Patient Office Visit  Subjective:  Patient ID: Laurie Brown, female    DOB: 20-Sep-1965  Age: 57 y.o. MRN: [id number]  CC:  Chief Complaint  Patient presents with   Chronic Care Management    3 month f/u   Sore Throat    Pt reports sore throat and ear pain since 01/29/23    HPI Laurie Brown is a 57 y.o. female with past medical history of HTN, T2DM and obesity presents for f/u of  chronic medical conditions with c/o sore throat. No fever, chills, body aches, headaches, sinus pain and pressure. No congestion or runny nose. She does reports a nonproductive cough.   Past Medical History:  Diagnosis Date   Diabetes mellitus without complication (HCC)    Hypertension     Past Surgical History:  Procedure Laterality Date   TUBAL LIGATION     TUBAL LIGATION      History reviewed. No pertinent family history.  Social History   Socioeconomic History   Marital status: Divorced    Spouse name: Not on file   Number of children: Not on file   Years of education: Not on file   Highest education level: Not on file  Occupational History   Not on file  Tobacco Use   Smoking status: Never   Smokeless tobacco: Never  Vaping Use   Vaping status: Never Used  Substance and Sexual Activity   Alcohol use: No   Drug use: No   Sexual activity: Yes    Birth control/protection: Surgical  Other Topics Concern   Not on file  Social History Narrative   Not on file   Social Determinants of Health   Financial Resource Strain: Not on file  Food Insecurity: Not on file  Transportation Needs: Not on file  Physical Activity: Not on file  Stress: Not on file  Social Connections: Not on file  Intimate Partner Violence: Not on file    Outpatient Medications Prior to Visit  Medication Sig Dispense Refill   amoxicillin (AMOXIL) 875 MG tablet Take 1 tablet (875 mg total) by mouth 2 (two) times daily. 20 tablet 0   ibuprofen (ADVIL) 800 MG tablet Take 1 tablet (800 mg  total) by mouth every 8 (eight) hours as needed for fever, moderate pain or mild pain. 21 tablet 0   JARDIANCE 10 MG TABS tablet TAKE 1 TABLET(10 MG) BY MOUTH DAILY BEFORE BREAKFAST 30 tablet 2   losartan (COZAAR) 25 MG tablet TAKE 1 TABLET BY MOUTH DAILY 90 tablet 0   metoprolol tartrate (LOPRESSOR) 100 MG tablet TAKE 1 TABLET(100 MG) BY MOUTH TWICE DAILY 30 tablet 6   rosuvastatin (CRESTOR) 10 MG tablet TAKE 1 TABLET(10 MG) BY MOUTH DAILY 90 tablet 0   Vitamin D, Ergocalciferol, (DRISDOL) 1.25 MG (50000 UNIT) CAPS capsule Take 1 capsule (50,000 Units total) by mouth every 7 (seven) days. 7 capsule 2   No facility-administered medications prior to visit.    Allergies  Allergen Reactions   Lisinopril Cough    ROS Review of Systems  Constitutional:  Negative for chills and fever.  HENT:  Positive for sore throat. Negative for congestion, sinus pressure and sinus pain.   Eyes:  Negative for visual disturbance.  Respiratory:  Positive for cough (dry cough). Negative for chest tightness and shortness of breath.   Neurological:  Negative for dizziness and headaches.      Objective:    Physical Exam HENT:     Head: Normocephalic.  Right Ear: There is impacted cerumen.     Left Ear: There is impacted cerumen.     Mouth/Throat:     Mouth: Mucous membranes are moist.  Cardiovascular:     Rate and Rhythm: Normal rate.     Heart sounds: Normal heart sounds.  Pulmonary:     Effort: Pulmonary effort is normal.     Breath sounds: Normal breath sounds.  Neurological:     Mental Status: She is alert.     BP (!) 158/90 (BP Location: Left Arm)   Pulse 80   Ht 5\' 7"  (1.702 m)   Wt 246 lb 0.6 oz (111.6 kg)   LMP 05/06/2016   SpO2 95%   BMI 38.54 kg/m  Wt Readings from Last 3 Encounters:  02/01/23 246 lb 0.6 oz (111.6 kg)  11/22/22 253 lb (114.8 kg)  11/02/22 252 lb 1.9 oz (114.4 kg)    Lab Results  Component Value Date   TSH 1.740 06/21/2022   Lab Results  Component  Value Date   WBC 6.5 06/21/2022   HGB 12.6 06/21/2022   HCT 41.0 06/21/2022   MCV 78 (L) 06/21/2022   PLT 374 06/21/2022   Lab Results  Component Value Date   NA 142 06/21/2022   K 3.8 06/21/2022   CO2 24 06/21/2022   GLUCOSE 73 06/21/2022   BUN 14 06/21/2022   CREATININE 1.10 (H) 06/21/2022   BILITOT 0.3 06/21/2022   ALKPHOS 91 06/21/2022   AST 30 06/21/2022   ALT 31 06/21/2022   PROT 8.1 06/21/2022   ALBUMIN 4.2 06/21/2022   CALCIUM 9.9 06/21/2022   ANIONGAP 9 01/02/2020   EGFR 60 06/21/2022   Lab Results  Component Value Date   CHOL 187 06/21/2022   Lab Results  Component Value Date   HDL 31 (L) 06/21/2022   Lab Results  Component Value Date   LDLCALC 128 (H) 06/21/2022   Lab Results  Component Value Date   TRIG 153 (H) 06/21/2022   Lab Results  Component Value Date   CHOLHDL 6.0 (H) 06/21/2022   Lab Results  Component Value Date   HGBA1C 6.4 (H) 06/21/2022      Assessment & Plan:  Primary hypertension Assessment & Plan: Uncontrolled She takes losartan 50 mg daily and metoprolol 100 mg twice daily and reports compliance to treatment regimen Denies headaches, dizziness, blurred vision, chest pain, palpitation Reports that she has been under immense stress as of lately Encouraged to check ambulatory reading, reports to the ED for BP>180/120 on two occasions with symptoms of chest pain, severe headaches and blurred vision Encouraged to bring BP log with her at her next appointment Low-sodium diet and increase physical activity encouraged If BP remains elevated, will make adjustments to BP meds BP Readings from Last 3 Encounters:  02/01/23 (!) 158/90  11/22/22 (!) 201/105  11/02/22 138/86      Type 2 diabetes mellitus without complication, unspecified whether long term insulin use (HCC) Assessment & Plan: She take jardiance 10 mg daily Denies polyuria, polydipsia and polyphagia  Encouraged to avoid foods and beverages high in sugar Lab Results   Component Value Date   HGBA1C 6.4 (H) 06/21/2022      Orders: -     HM Diabetes Foot Exam  Viral illness Assessment & Plan: Negative strep throat in the clinic Pending covid testing Encouraged supportive care Promethazine DM ordered Take medication as prescribed. Increase fluids and allow for plenty of rest. Recommend Tylenol as needed for pain,  fever, or general discomfort. Warm salt water gargles 3-4 times daily to help with throat pain or discomfort. Recommend using a humidifier at bedtime during sleep to help with cough and nasal congestion. Follow-up if your symptoms do not improve     Orders: -     Promethazine-DM; Take 5 mLs by mouth 4 (four) times daily as needed.  Dispense: 118 mL; Refill: 0  Sore throat -     POCT rapid strep A -     COVID-19, Flu A+B and RSV  Impacted cerumen of both ears Assessment & Plan: Ear irrigation performed Encouraged to use otc debrox earwax removal kit   IFG (impaired fasting glucose) -     Hemoglobin A1c  Vitamin D deficiency -     VITAMIN D 25 Hydroxy (Vit-D Deficiency, Fractures)  Other specified hypothyroidism -     TSH + free T4  Other hyperlipidemia -     Lipid panel -     CMP14+EGFR -     CBC with Differential/Platelet  Note: This chart has been completed using Engineer, civil (consulting) software, and while attempts have been made to ensure accuracy, certain words and phrases may not be transcribed as intended.    Follow-up: Return in about 2 weeks (around 02/15/2023) for BP.   Gilmore Laroche, FNP

## 2023-02-01 NOTE — Patient Instructions (Addendum)
I appreciate the opportunity to provide care to you today!    Follow up:  2 weeks for pap smear and BP   Labs: please stop by the lab today/ during the week to get your blood drawn (CBC, CMP, TSH, Lipid profile, HgA1c, Vit D)   You tested negative for strep in the clinic Please stop by the clinic tomorrow to get tested for COVID I recommend support care Take medication as prescribed. Increase fluids and allow for plenty of rest. Recommend Tylenol as needed for pain, fever, or general discomfort. Warm salt water gargles 3-4 times daily to help with throat pain or discomfort. Recommend using a humidifier at bedtime during sleep to help with cough and nasal congestion. Follow-up if your symptoms do not improve     High blood pressure -check your blood pressure at home daily -Bring the log with you at your next appointment -Please report to the ED for BP>180/120 on two occasions with symptoms pf headaches, chest pain and dizziness -I recommend low sodium diet with increased physical activity -ensure adequate rest and increased fluid intake, at least 64 ounces daily  Attached with your AVS, you will find valuable resources for self-education. I highly recommend dedicating some time to thoroughly examine them.   Please continue to a heart-healthy diet and increase your physical activities. Try to exercise for at least five days a week.    It was a pleasure to see you and I look forward to continuing to work together on your health and well-being. Please do not hesitate to call the office if you need care or have questions about your care.  In case of emergency, please visit the Emergency Department for urgent care, or contact our clinic at [phone] to schedule an appointment. We're here to help you!   Have a wonderful day and week. With Gratitude, Gilmore Laroche MSN, FNP-BC

## 2023-02-02 DIAGNOSIS — H6123 Impacted cerumen, bilateral: Secondary | ICD-10-CM | POA: Insufficient documentation

## 2023-02-02 DIAGNOSIS — B349 Viral infection, unspecified: Secondary | ICD-10-CM | POA: Insufficient documentation

## 2023-02-02 NOTE — Assessment & Plan Note (Addendum)
Negative strep throat in the clinic Pending covid testing Encouraged supportive care Promethazine DM ordered Take medication as prescribed. Increase fluids and allow for plenty of rest. Recommend Tylenol as needed for pain, fever, or general discomfort. Warm salt water gargles 3-4 times daily to help with throat pain or discomfort. Recommend using a humidifier at bedtime during sleep to help with cough and nasal congestion. Follow-up if your symptoms do not improve

## 2023-02-02 NOTE — Assessment & Plan Note (Addendum)
Ear irrigation performed Encouraged to use otc debrox earwax removal kit

## 2023-02-02 NOTE — Assessment & Plan Note (Signed)
She take jardiance 10 mg daily Denies polyuria, polydipsia and polyphagia  Encouraged to avoid foods and beverages high in sugar Lab Results  Component Value Date   HGBA1C 6.4 (H) 06/21/2022

## 2023-02-02 NOTE — Assessment & Plan Note (Signed)
Uncontrolled She takes losartan 50 mg daily and metoprolol 100 mg twice daily and reports compliance to treatment regimen Denies headaches, dizziness, blurred vision, chest pain, palpitation Reports that she has been under immense stress as of lately Encouraged to check ambulatory reading, reports to the ED for BP>180/120 on two occasions with symptoms of chest pain, severe headaches and blurred vision Encouraged to bring BP log with her at her next appointment Low-sodium diet and increase physical activity encouraged If BP remains elevated, will make adjustments to BP meds BP Readings from Last 3 Encounters:  02/01/23 (!) 158/90  11/22/22 (!) 201/105  11/02/22 138/86

## 2023-02-28 ENCOUNTER — Ambulatory Visit: Payer: MEDICAID | Admitting: Family Medicine

## 2023-03-20 ENCOUNTER — Encounter: Payer: Self-pay | Admitting: Pharmacist

## 2023-03-22 ENCOUNTER — Other Ambulatory Visit: Payer: Self-pay | Admitting: Family Medicine

## 2023-03-22 DIAGNOSIS — I10 Essential (primary) hypertension: Secondary | ICD-10-CM

## 2023-03-23 ENCOUNTER — Encounter: Payer: Self-pay | Admitting: Family Medicine

## 2023-03-23 ENCOUNTER — Other Ambulatory Visit: Payer: Self-pay | Admitting: Family Medicine

## 2023-03-23 ENCOUNTER — Ambulatory Visit (INDEPENDENT_AMBULATORY_CARE_PROVIDER_SITE_OTHER): Payer: MEDICAID | Admitting: Family Medicine

## 2023-03-23 ENCOUNTER — Other Ambulatory Visit: Payer: Self-pay | Admitting: Pharmacist

## 2023-03-23 VITALS — BP 198/120 | HR 77 | Ht 67.0 in | Wt 248.0 lb

## 2023-03-23 DIAGNOSIS — I10 Essential (primary) hypertension: Secondary | ICD-10-CM

## 2023-03-23 MED ORDER — TELMISARTAN-AMLODIPINE 40-10 MG PO TABS
1.0000 | ORAL_TABLET | Freq: Every day | ORAL | 1 refills | Status: DC
Start: 2023-03-23 — End: 2023-07-01

## 2023-03-23 MED ORDER — HYDRALAZINE HCL 10 MG PO TABS
10.0000 mg | ORAL_TABLET | Freq: Every day | ORAL | 1 refills | Status: DC
Start: 2023-03-23 — End: 2023-04-25

## 2023-03-23 NOTE — Patient Instructions (Addendum)
I appreciate the opportunity to provide care to you today!    Follow up:  1 months  Labs: please stop by the lab today to get your blood drawn   Hypertension Management -Please discontinue taking losartan 50 mg daily and metoprolol 100 mg twice daily. -Begin taking telmisartan-amlodipine 40-10 mg daily. Take telmisartan-amlodipine 40-10mg  and check your blood pressure an hour after taking your medication  -If your blood pressure is greater than 140/90, take hydralazine 10 mg daily. Do not take hydralazine 10 mg if your blood pressure is less than 140/90 after taking telmisartan-amlodipine 40-10 mg. Side Effects: In the initial days of therapy, you may experience dizziness or lightheadedness as your body adjusts to the lower blood pressure; this is expected. Diet and Lifestyle: Adhere to a low-sodium diet, limiting intake to less than 1500 mg daily, and increase your physical activity. Avoid over-the-counter NSAIDs such as ibuprofen and naproxen while on this medication. Hydration and Nutrition: Stay well-hydrated by drinking at least 64 ounces of water daily. Increase your servings of fruits and vegetables and avoid excessive sodium in your diet. Long-Term Considerations: Uncontrolled hypertension can increase the risk of cardiovascular diseases, including stroke, coronary artery disease, and heart failure.   I recommend seeking urgent care in the Emergency Department if your blood pressure exceeds 180/120 and you experience symptoms such as headaches, dizziness, chest pain, palpitations, or shortness of breath.  Referral: [facility]   Please continue to a heart-healthy diet and increase your physical activities. Try to exercise for at least five days a week.    It was a pleasure to see you and I look forward to continuing to work together on your health and well-being. Please do not hesitate to call the office if you need care or have questions about your care.  In  case of emergency, please visit the Emergency Department for urgent care, or contact our clinic at [phone] to schedule an appointment. We're here to help you!   Have a wonderful day and week. With Gratitude, Gilmore Laroche MSN, FNP-BC

## 2023-03-23 NOTE — Assessment & Plan Note (Addendum)
Hypertension Management -Please discontinue taking losartan 50 mg daily and metoprolol 100 mg twice daily. -Begin taking telmisartan-amlodipine 40-10 mg daily. Take telmisartan-amlodipine 40-10mg  and check your blood pressure an hour after taking your medication  -If your blood pressure is greater than 140/90, take hydralazine 10 mg daily. Do not take hydralazine 10 mg if your blood pressure is less than 140/90 after taking telmisartan-amlodipine 40-10 mg. Side Effects: In the initial days of therapy, you may experience dizziness or lightheadedness as your body adjusts to the lower blood pressure; this is expected. Diet and Lifestyle: Adhere to a low-sodium diet, limiting intake to less than 1500 mg daily, and increase your physical activity. Avoid over-the-counter NSAIDs such as ibuprofen and naproxen while on this medication. Hydration and Nutrition: Stay well-hydrated by drinking at least 64 ounces of water daily. Increase your servings of fruits and vegetables and avoid excessive sodium in your diet. Long-Term Considerations: Uncontrolled hypertension can increase the risk of cardiovascular diseases, including stroke, coronary artery disease, and heart failure.    We will evaluate secondary causes of her elevated blood pressure by ordering imaging studies of the kidneys to rule out renal artery stenosis and conducting lab tests to rule out primary hyperaldosteronism and Cushing syndrome.  BP Readings from Last 3 Encounters:  03/23/23 (!) 198/120  02/01/23 (!) 158/90  11/22/22 (!) 201/105

## 2023-03-23 NOTE — Progress Notes (Unsigned)
   03/23/2023 Name: Laurie Brown MRN: [id number] DOB: [birth year]  Chief Complaint  Patient presents with   Hypertension    VM left with patient re: blood pressure monitor   Hypertension:  Current medications: metop, losartan Medications previously tried:  quinapril, hydrochlorothiazide, lisinopril (cough with lisinopril)   Objective:  Lab Results  Component Value Date   HGBA1C 6.4 (H) 06/21/2022    Lab Results  Component Value Date   CREATININE 1.10 (H) 06/21/2022   BUN 14 06/21/2022   NA 142 06/21/2022   K 3.8 06/21/2022   CL 105 06/21/2022   CO2 24 06/21/2022    Lab Results  Component Value Date   CHOL 187 06/21/2022   HDL 31 (L) 06/21/2022   LDLCALC 128 (H) 06/21/2022   TRIG 153 (H) 06/21/2022   CHOLHDL 6.0 (H) 06/21/2022    Medications Reviewed Today     Reviewed by Danella Maiers, [facility] (Pharmacist) on 03/23/23 at 1240  Med List Status: <None>   Medication Order Taking? Sig Documenting Provider Last Dose Status Informant  amoxicillin (AMOXIL) 875 MG tablet [id number] No Take 1 tablet (875 mg total) by mouth 2 (two) times daily. Peter Garter, PA Taking Active   ibuprofen (ADVIL) 800 MG tablet [id number] No Take 1 tablet (800 mg total) by mouth every 8 (eight) hours as needed for fever, moderate pain or mild pain. Peter Garter, [place] Taking Active   JARDIANCE 10 MG TABS tablet [id number] No TAKE 1 TABLET(10 MG) BY MOUTH DAILY BEFORE Ella Bodo, FNP Taking Active   losartan (COZAAR) 25 MG tablet [id number]  TAKE 1 TABLET BY MOUTH DAILY Gilmore Laroche, FNP  Active   metoprolol tartrate (LOPRESSOR) 100 MG tablet [id number] No TAKE 1 TABLET(100 MG) BY MOUTH TWICE DAILY Gilmore Laroche, FNP Taking Active Self  promethazine-dextromethorphan (PROMETHAZINE-DM) 6.25-15 MG/5ML syrup [id number]  Take 5 mLs by mouth 4 (four) times daily as needed. Gilmore Laroche, FNP  Active   rosuvastatin (CRESTOR) 10 MG tablet [id number] No TAKE 1 TABLET(10 MG) BY MOUTH  DAILY Gilmore Laroche, FNP Taking Active   Vitamin D, Ergocalciferol, (DRISDOL) 1.25 MG (50000 UNIT) CAPS capsule [id number] No Take 1 capsule (50,000 Units total) by mouth every 7 (seven) days. Gilmore Laroche, FNP Taking Active Self            Assessment/Plan:   Would like to touch base with patient re: blood pressure monitor/hypertension   Follow Up Plan: will send mychart message and left patient VM requesting return call to PharmD [phone] Patient also has new referral placed to advanced hypertension clinic--her BP in clinic with PCP yesterday was 198-208/120-124 Hydralazine was initiated today, telmisartan/amlodipine Continues on metoprolol   Kieth Brightly, PharmD, BCACP Clinical Pharmacist, [facility]

## 2023-03-23 NOTE — Progress Notes (Signed)
Established Patient Office Visit  Subjective:  Patient ID: Laurie Brown, female    DOB: [birth year]  Age: 57 y.o. MRN: [id number]  CC:  Chief Complaint  Patient presents with   Hypertension    Follow up.    HPI Laurie Brown is a 57 y.o. female with past medical history of hypertension presents for f/u of hypertension.   Hypertension: The patient is currently taking losartan 50 mg and metoprolol 100 mg twice daily. She reports compliance with her treatment regimen; however, her blood pressure remains uncontrolled. She denies a history of sleep apnea, chronic kidney disease, vascular disorders, or thyroid disease. The patient is asymptomatic during today's clinic visit.   Past Medical History:  Diagnosis Date   Diabetes mellitus without complication (HCC)    Hypertension     Past Surgical History:  Procedure Laterality Date   TUBAL LIGATION     TUBAL LIGATION      History reviewed. No pertinent family history.  Social History   Socioeconomic History   Marital status: Divorced    Spouse name: Not on file   Number of children: Not on file   Years of education: Not on file   Highest education level: Not on file  Occupational History   Not on file  Tobacco Use   Smoking status: Never   Smokeless tobacco: Never  Vaping Use   Vaping status: Never Used  Substance and Sexual Activity   Alcohol use: No   Drug use: No   Sexual activity: Yes    Birth control/protection: Surgical  Other Topics Concern   Not on file  Social History Narrative   Not on file   Social Determinants of Health   Financial Resource Strain: Not on file  Food Insecurity: Not on file  Transportation Needs: Not on file  Physical Activity: Not on file  Stress: Not on file  Social Connections: Not on file  Intimate Partner Violence: Not on file    Outpatient Medications Prior to Visit  Medication Sig Dispense Refill   ibuprofen (ADVIL) 800 MG tablet Take 1 tablet (800 mg total) by  mouth every 8 (eight) hours as needed for fever, moderate pain or mild pain. 21 tablet 0   JARDIANCE 10 MG TABS tablet TAKE 1 TABLET(10 MG) BY MOUTH DAILY BEFORE BREAKFAST 30 tablet 2   metoprolol tartrate (LOPRESSOR) 100 MG tablet TAKE 1 TABLET(100 MG) BY MOUTH TWICE DAILY 30 tablet 6   rosuvastatin (CRESTOR) 10 MG tablet TAKE 1 TABLET(10 MG) BY MOUTH DAILY 90 tablet 0   Vitamin D, Ergocalciferol, (DRISDOL) 1.25 MG (50000 UNIT) CAPS capsule Take 1 capsule (50,000 Units total) by mouth every 7 (seven) days. 7 capsule 2   amoxicillin (AMOXIL) 875 MG tablet Take 1 tablet (875 mg total) by mouth 2 (two) times daily. 20 tablet 0   losartan (COZAAR) 25 MG tablet TAKE 1 TABLET BY MOUTH DAILY 90 tablet 0   promethazine-dextromethorphan (PROMETHAZINE-DM) 6.25-15 MG/5ML syrup Take 5 mLs by mouth 4 (four) times daily as needed. 118 mL 0   No facility-administered medications prior to visit.    Allergies  Allergen Reactions   Lisinopril Cough    ROS Review of Systems  Constitutional:  Negative for chills and fever.  Eyes:  Negative for visual disturbance.  Respiratory:  Negative for chest tightness and shortness of breath.   Neurological:  Negative for dizziness and headaches.      Objective:    Physical Exam HENT:     Head: Normocephalic.  Mouth/Throat:     Mouth: Mucous membranes are moist.  Cardiovascular:     Rate and Rhythm: Normal rate.     Heart sounds: Normal heart sounds.  Pulmonary:     Effort: Pulmonary effort is normal.     Breath sounds: Normal breath sounds.  Neurological:     Mental Status: She is alert.     BP (!) 198/120 (BP Location: Left Arm)   Pulse 77   Ht 5\' 7"  (1.702 m)   Wt 248 lb 0.6 oz (112.5 kg)   LMP 05/06/2016   SpO2 93%   BMI 38.85 kg/m  Wt Readings from Last 3 Encounters:  03/23/23 248 lb 0.6 oz (112.5 kg)  02/01/23 246 lb 0.6 oz (111.6 kg)  11/22/22 253 lb (114.8 kg)    Lab Results  Component Value Date   TSH 1.740 06/21/2022   Lab  Results  Component Value Date   WBC 6.5 06/21/2022   HGB 12.6 06/21/2022   HCT 41.0 06/21/2022   MCV 78 (L) 06/21/2022   PLT 374 06/21/2022   Lab Results  Component Value Date   NA 142 06/21/2022   K 3.8 06/21/2022   CO2 24 06/21/2022   GLUCOSE 73 06/21/2022   BUN 14 06/21/2022   CREATININE 1.10 (H) 06/21/2022   BILITOT 0.3 06/21/2022   ALKPHOS 91 06/21/2022   AST 30 06/21/2022   ALT 31 06/21/2022   PROT 8.1 06/21/2022   ALBUMIN 4.2 06/21/2022   CALCIUM 9.9 06/21/2022   ANIONGAP 9 01/02/2020   EGFR 60 06/21/2022   Lab Results  Component Value Date   CHOL 187 06/21/2022   Lab Results  Component Value Date   HDL 31 (L) 06/21/2022   Lab Results  Component Value Date   LDLCALC 128 (H) 06/21/2022   Lab Results  Component Value Date   TRIG 153 (H) 06/21/2022   Lab Results  Component Value Date   CHOLHDL 6.0 (H) 06/21/2022   Lab Results  Component Value Date   HGBA1C 6.4 (H) 06/21/2022      Assessment & Plan:  Primary hypertension Assessment & Plan: Hypertension Management -Please discontinue taking losartan 50 mg daily and metoprolol 100 mg twice daily. -Begin taking telmisartan-amlodipine 40-10 mg daily. Take telmisartan-amlodipine 40-10mg  and check your blood pressure an hour after taking your medication  -If your blood pressure is greater than 140/90, take hydralazine 10 mg daily. Do not take hydralazine 10 mg if your blood pressure is less than 140/90 after taking telmisartan-amlodipine 40-10 mg. Side Effects: In the initial days of therapy, you may experience dizziness or lightheadedness as your body adjusts to the lower blood pressure; this is expected. Diet and Lifestyle: Adhere to a low-sodium diet, limiting intake to less than 1500 mg daily, and increase your physical activity. Avoid over-the-counter NSAIDs such as ibuprofen and naproxen while on this medication. Hydration and Nutrition: Stay well-hydrated by drinking at least 64 ounces of water daily.  Increase your servings of fruits and vegetables and avoid excessive sodium in your diet. Long-Term Considerations: Uncontrolled hypertension can increase the risk of cardiovascular diseases, including stroke, coronary artery disease, and heart failure.    We will evaluate secondary causes of her elevated blood pressure by ordering imaging studies of the kidneys to rule out renal artery stenosis and conducting lab tests to rule out primary hyperaldosteronism and Cushing syndrome.  BP Readings from Last 3 Encounters:  03/23/23 (!) 198/120  02/01/23 (!) 158/90  11/22/22 (!) 201/105    Orders: -  Ambulatory referral to [facility] Hypertension Clinic -     Aldosterone + renin activity w/ ratio -     Metanephrines, urine, 24 hour -     Telmisartan-amLODIPine; Take 1 tablet by mouth daily.  Dispense: 30 tablet; Refill: 1 -     hydrALAZINE HCl; Take 1 tablet (10 mg total) by mouth daily.  Dispense: 30 tablet; Refill: 1 -     US RENAL ARTERY DUPLEX COMPLETE  Note: This chart has been completed using Engineer, civil (consulting) software, and while attempts have been made to ensure accuracy, certain words and phrases may not be transcribed as intended.    Follow-up: Return in about 1 month (around 04/22/2023).   Gilmore Laroche, FNP

## 2023-03-30 LAB — ALDOSTERONE + RENIN ACTIVITY W/ RATIO
Aldos/Renin Ratio: 36.8 — ABNORMAL HIGH (ref 0.0–30.0)
Aldosterone: 8.4 ng/dL (ref 0.0–30.0)
Renin Activity, Plasma: 0.228 ng/mL/h (ref 0.167–5.380)

## 2023-03-31 ENCOUNTER — Other Ambulatory Visit: Payer: Self-pay | Admitting: Family Medicine

## 2023-03-31 DIAGNOSIS — E2601 Conn's syndrome: Secondary | ICD-10-CM

## 2023-04-25 ENCOUNTER — Ambulatory Visit (INDEPENDENT_AMBULATORY_CARE_PROVIDER_SITE_OTHER): Payer: MEDICAID | Admitting: Family Medicine

## 2023-04-25 ENCOUNTER — Encounter: Payer: Self-pay | Admitting: Family Medicine

## 2023-04-25 VITALS — BP 103/78 | HR 79 | Ht 67.0 in | Wt 247.1 lb

## 2023-04-25 DIAGNOSIS — I10 Essential (primary) hypertension: Secondary | ICD-10-CM | POA: Diagnosis not present

## 2023-04-25 DIAGNOSIS — Z7984 Long term (current) use of oral hypoglycemic drugs: Secondary | ICD-10-CM | POA: Diagnosis not present

## 2023-04-25 DIAGNOSIS — E119 Type 2 diabetes mellitus without complications: Secondary | ICD-10-CM

## 2023-04-25 MED ORDER — EMPAGLIFLOZIN 10 MG PO TABS: 10.0000 mg | ORAL_TABLET | Freq: Every day | ORAL | 2 refills | Status: DC

## 2023-04-25 MED ORDER — HYDRALAZINE HCL 10 MG PO TABS
10.0000 mg | ORAL_TABLET | Freq: Every day | ORAL | 1 refills | Status: DC
Start: 2023-04-25 — End: 2023-10-05

## 2023-04-25 NOTE — Patient Instructions (Addendum)
  I appreciate the opportunity to provide care to you today!    Follow up:  1 month pap smear  Labs: please stop by the lab today to get your blood drawn (BMP)  Please continue taking metoprolol 100 mg twice daily, losartan 25 mg daily, and hydralazine 10 mg daily   Attached with your AVS, you will find valuable resources for self-education. I highly recommend dedicating some time to thoroughly examine them.   Please continue to a heart-healthy diet and increase your physical activities. Try to exercise for at least five days a week.    It was a pleasure to see you and I look forward to continuing to work together on your health and well-being. Please do not hesitate to call the office if you need care or have questions about your care.  In case of emergency, please visit the Emergency Department for urgent care, or contact our clinic at [phone] to schedule an appointment. We're here to help you!   Have a wonderful day and week. With Gratitude, Gilmore Laroche MSN, FNP-BC

## 2023-04-25 NOTE — Assessment & Plan Note (Signed)
Controlled Encouraged to continue taking metoprolol 100 mg twice daily, losartan 25 mg daily, and hydralazine 10 mg daily Low-sodium diet with increased physical activity encouraged BP Readings from Last 3 Encounters:  04/25/23 103/78  03/23/23 (!) 198/120  02/01/23 (!) 158/90

## 2023-04-25 NOTE — Progress Notes (Signed)
Established Patient Office Visit  Subjective:  Patient ID: Laurie Brown, female    DOB: [birth year]  Age: 57 y.o. MRN: [id number]  CC:  Chief Complaint  Patient presents with   Care Management    1 month f/u    HPI Laurie Brown is a 57 y.o. female presents for hypertension f/u.  Hypertension: The patient reports being unable to pick up her prescription for telmisartan amlodipine due to the cost and the medication being out of stock. She has been taking metoprolol 100 mg twice daily, losartan 25 mg daily, and hydralazine 10 mg daily. During her visit to the clinic today, the patient appears asymptomatic and states that she is feeling well overall.   Past Medical History:  Diagnosis Date   Diabetes mellitus without complication (HCC)    Hypertension     Past Surgical History:  Procedure Laterality Date   TUBAL LIGATION     TUBAL LIGATION      History reviewed. No pertinent family history.  Social History   Socioeconomic History   Marital status: Divorced    Spouse name: Not on file   Number of children: Not on file   Years of education: Not on file   Highest education level: Not on file  Occupational History   Not on file  Tobacco Use   Smoking status: Never   Smokeless tobacco: Never  Vaping Use   Vaping status: Never Used  Substance and Sexual Activity   Alcohol use: No   Drug use: No   Sexual activity: Yes    Birth control/protection: Surgical  Other Topics Concern   Not on file  Social History Narrative   Not on file   Social Determinants of Health   Financial Resource Strain: Not on file  Food Insecurity: Not on file  Transportation Needs: Not on file  Physical Activity: Not on file  Stress: Not on file  Social Connections: Not on file  Intimate Partner Violence: Not on file    Outpatient Medications Prior to Visit  Medication Sig Dispense Refill   ibuprofen (ADVIL) 800 MG tablet Take 1 tablet (800 mg total) by mouth every 8 (eight) hours  as needed for fever, moderate pain or mild pain. 21 tablet 0   metoprolol tartrate (LOPRESSOR) 100 MG tablet TAKE 1 TABLET(100 MG) BY MOUTH TWICE DAILY 30 tablet 6   rosuvastatin (CRESTOR) 10 MG tablet TAKE 1 TABLET(10 MG) BY MOUTH DAILY 90 tablet 0   Telmisartan-amLODIPine 40-10 MG TABS Take 1 tablet by mouth daily. 30 tablet 1   Vitamin D, Ergocalciferol, (DRISDOL) 1.25 MG (50000 UNIT) CAPS capsule Take 1 capsule (50,000 Units total) by mouth every 7 (seven) days. 7 capsule 2   hydrALAZINE (APRESOLINE) 10 MG tablet Take 1 tablet (10 mg total) by mouth daily. 30 tablet 1   JARDIANCE 10 MG TABS tablet TAKE 1 TABLET(10 MG) BY MOUTH DAILY BEFORE BREAKFAST 30 tablet 2   No facility-administered medications prior to visit.    Allergies  Allergen Reactions   Lisinopril Cough    ROS Review of Systems  Constitutional:  Negative for chills and fever.  Eyes:  Negative for visual disturbance.  Respiratory:  Negative for chest tightness and shortness of breath.   Neurological:  Negative for dizziness and headaches.      Objective:    Physical Exam HENT:     Head: Normocephalic.     Mouth/Throat:     Mouth: Mucous membranes are moist.  Cardiovascular:  Rate and Rhythm: Normal rate.     Heart sounds: Normal heart sounds.  Pulmonary:     Effort: Pulmonary effort is normal.     Breath sounds: Normal breath sounds.  Neurological:     Mental Status: She is alert.     BP 103/78   Pulse 79   Ht 5\' 7"  (1.702 m)   Wt 247 lb 1.9 oz (112.1 kg)   LMP 05/06/2016   SpO2 96%   BMI 38.70 kg/m  Wt Readings from Last 3 Encounters:  04/25/23 247 lb 1.9 oz (112.1 kg)  03/23/23 248 lb 0.6 oz (112.5 kg)  02/01/23 246 lb 0.6 oz (111.6 kg)    Lab Results  Component Value Date   TSH 1.740 06/21/2022   Lab Results  Component Value Date   WBC 6.5 06/21/2022   HGB 12.6 06/21/2022   HCT 41.0 06/21/2022   MCV 78 (L) 06/21/2022   PLT 374 06/21/2022   Lab Results  Component Value Date    NA 142 06/21/2022   K 3.8 06/21/2022   CO2 24 06/21/2022   GLUCOSE 73 06/21/2022   BUN 14 06/21/2022   CREATININE 1.10 (H) 06/21/2022   BILITOT 0.3 06/21/2022   ALKPHOS 91 06/21/2022   AST 30 06/21/2022   ALT 31 06/21/2022   PROT 8.1 06/21/2022   ALBUMIN 4.2 06/21/2022   CALCIUM 9.9 06/21/2022   ANIONGAP 9 01/02/2020   EGFR 60 06/21/2022   Lab Results  Component Value Date   CHOL 187 06/21/2022   Lab Results  Component Value Date   HDL 31 (L) 06/21/2022   Lab Results  Component Value Date   LDLCALC 128 (H) 06/21/2022   Lab Results  Component Value Date   TRIG 153 (H) 06/21/2022   Lab Results  Component Value Date   CHOLHDL 6.0 (H) 06/21/2022   Lab Results  Component Value Date   HGBA1C 6.4 (H) 06/21/2022      Assessment & Plan:  Primary hypertension Assessment & Plan: Controlled Encouraged to continue taking metoprolol 100 mg twice daily, losartan 25 mg daily, and hydralazine 10 mg daily Low-sodium diet with increased physical activity encouraged BP Readings from Last 3 Encounters:  04/25/23 103/78  03/23/23 (!) 198/120  02/01/23 (!) 158/90     Orders: -     BMP8+EGFR -     hydrALAZINE HCl; Take 1 tablet (10 mg total) by mouth daily.  Dispense: 90 tablet; Refill: 1  Type 2 diabetes mellitus without complication, unspecified whether long term insulin use (HCC) -     Empagliflozin; Take 1 tablet (10 mg total) by mouth daily.  Dispense: 30 tablet; Refill: 2   Note: This chart has been completed using Engineer, civil (consulting) software, and while attempts have been made to ensure accuracy, certain words and phrases may not be transcribed as intended.   Follow-up: Return in about 1 month (around 05/26/2023) for pap smear.   Gilmore Laroche, FNP

## 2023-05-28 ENCOUNTER — Ambulatory Visit: Payer: MEDICAID | Admitting: Family Medicine

## 2023-05-31 ENCOUNTER — Institutional Professional Consult (permissible substitution) (HOSPITAL_BASED_OUTPATIENT_CLINIC_OR_DEPARTMENT_OTHER): Payer: MEDICAID | Admitting: Family

## 2023-06-05 ENCOUNTER — Ambulatory Visit: Payer: MEDICAID | Admitting: Family Medicine

## 2023-06-14 ENCOUNTER — Other Ambulatory Visit: Payer: Self-pay | Admitting: Family Medicine

## 2023-06-14 DIAGNOSIS — I10 Essential (primary) hypertension: Secondary | ICD-10-CM

## 2023-06-28 ENCOUNTER — Encounter: Payer: Self-pay | Admitting: Family Medicine

## 2023-06-28 ENCOUNTER — Other Ambulatory Visit (HOSPITAL_COMMUNITY)
Admission: RE | Admit: 2023-06-28 | Discharge: 2023-06-28 | Disposition: A | Discharge status: Home / Self Care | Payer: MEDICAID | Source: Ambulatory Visit | Attending: Family Medicine | Admitting: Family Medicine

## 2023-06-28 ENCOUNTER — Ambulatory Visit (INDEPENDENT_AMBULATORY_CARE_PROVIDER_SITE_OTHER): Payer: MEDICAID | Admitting: Family Medicine

## 2023-06-28 VITALS — BP 138/88 | HR 79 | Ht 67.0 in | Wt 255.0 lb

## 2023-06-28 DIAGNOSIS — I10 Essential (primary) hypertension: Secondary | ICD-10-CM

## 2023-06-28 DIAGNOSIS — E119 Type 2 diabetes mellitus without complications: Secondary | ICD-10-CM

## 2023-06-28 DIAGNOSIS — Z124 Encounter for screening for malignant neoplasm of cervix: Secondary | ICD-10-CM | POA: Insufficient documentation

## 2023-06-28 NOTE — Assessment & Plan Note (Signed)
Well-controlled The patient complains of a mild headache, which she rates as 5 out of 10. No red flags are reported, such as neck stiffness, nausea, vomiting, or signs of a severe headache (e.g., "the worst headache of her life"). She reports taking Tylenol with some relief. Encouraged to continue her treatment regimen.  Encouraged to seek immediate medical attention at the Emergency Department if she experience any of the following symptoms in conjunction with her headaches:  Acute onset of a severe headache described as "the worst headache of my life," particularly in a patient with no prior history of headaches. Unrelenting headaches that do not improve with conservative treatments or pain that progressively worsens. Lancinating or "ice-pick" pain. Severe headaches accompanied by a stiff neck and/or fever. Headaches associated with changes in mental status or level of consciousness. Persistent headaches following trauma to the head or neck. A significant change in pattern or severity of headaches in a patient with a longstanding history of chronic headaches. Your prompt evaluation is crucial for appropriate diagnosis and management.

## 2023-06-28 NOTE — Assessment & Plan Note (Signed)
-  Cytology and HPV co-testing (preferred) every 5 years  - Pap due 2029

## 2023-06-28 NOTE — Patient Instructions (Addendum)
I appreciate the opportunity to provide care to you today!     Follow up: 3 months   Labs: please stop by the lab today to get your blood drawn ( HgA1c)   Attached with your AVS, you will find valuable resources for self-education. I highly recommend dedicating some time to thoroughly examine them.   Please continue to a heart-healthy diet and increase your physical activities. Try to exercise for at least five days a week.    It was a pleasure to see you and I look forward to continuing to work together on your health and well-being. Please do not hesitate to call the office if you need care or have questions about your care.  In case of emergency, please visit the Emergency Department for urgent care, or contact our clinic at [phone] to schedule an appointment. We're here to help you!   Have a wonderful day and week. With Gratitude, Gilmore Laroche MSN, FNP-BC

## 2023-06-28 NOTE — Progress Notes (Signed)
Established Patient Office Visit  Subjective:  Patient ID: Laurie Brown, female    DOB: [birth year]  Age: 57 y.o. MRN: [id number]  CC:  Chief Complaint  Patient presents with   Gynecologic Exam    Pap visit.   Headache    Pt reports a headache    HPI Puneet Megee is a 57 y.o. female  presents for for Pap smear with complaints of a mild headache. For the details of today's visit, please refer to the assessment and plan.    Past Medical History:  Diagnosis Date   Diabetes mellitus without complication (HCC)    Hypertension     Past Surgical History:  Procedure Laterality Date   TUBAL LIGATION     TUBAL LIGATION      History reviewed. No pertinent family history.  Social History   Socioeconomic History   Marital status: Divorced    Spouse name: Not on file   Number of children: Not on file   Years of education: Not on file   Highest education level: Not on file  Occupational History   Not on file  Tobacco Use   Smoking status: Never   Smokeless tobacco: Never  Vaping Use   Vaping status: Never Used  Substance and Sexual Activity   Alcohol use: No   Drug use: No   Sexual activity: Yes    Birth control/protection: Surgical  Other Topics Concern   Not on file  Social History Narrative   Not on file   Social Drivers of Health   Financial Resource Strain: Not on file  Food Insecurity: Not on file  Transportation Needs: Not on file  Physical Activity: Not on file  Stress: Not on file  Social Connections: Not on file  Intimate Partner Violence: Not on file    Outpatient Medications Prior to Visit  Medication Sig Dispense Refill   empagliflozin (JARDIANCE) 10 MG TABS tablet Take 1 tablet (10 mg total) by mouth daily. 30 tablet 2   hydrALAZINE (APRESOLINE) 10 MG tablet Take 1 tablet (10 mg total) by mouth daily. 90 tablet 1   ibuprofen (ADVIL) 800 MG tablet Take 1 tablet (800 mg total) by mouth every 8 (eight) hours as needed for fever, moderate pain  or mild pain. 21 tablet 0   losartan (COZAAR) 25 MG tablet TAKE 1 TABLET BY MOUTH DAILY 90 tablet 0   metoprolol tartrate (LOPRESSOR) 100 MG tablet TAKE 1 TABLET(100 MG) BY MOUTH TWICE DAILY 30 tablet 6   rosuvastatin (CRESTOR) 10 MG tablet TAKE 1 TABLET(10 MG) BY MOUTH DAILY 90 tablet 0   Telmisartan-amLODIPine 40-10 MG TABS Take 1 tablet by mouth daily. 30 tablet 1   Vitamin D, Ergocalciferol, (DRISDOL) 1.25 MG (50000 UNIT) CAPS capsule Take 1 capsule (50,000 Units total) by mouth every 7 (seven) days. 7 capsule 2   No facility-administered medications prior to visit.    Allergies  Allergen Reactions   Lisinopril Cough    ROS Review of Systems  Constitutional:  Negative for chills and fever.  Eyes:  Negative for visual disturbance.  Respiratory:  Negative for chest tightness and shortness of breath.   Genitourinary:  Negative for pelvic pain, vaginal bleeding and vaginal discharge.  Neurological:  Positive for headaches. Negative for dizziness.      Objective:    Physical Exam HENT:     Head: Normocephalic.     Mouth/Throat:     Mouth: Mucous membranes are moist.  Cardiovascular:     Rate and  Rhythm: Normal rate.     Heart sounds: Normal heart sounds.  Pulmonary:     Effort: Pulmonary effort is normal.     Breath sounds: Normal breath sounds.  Genitourinary:    Exam position: Lithotomy position.     Tanner stage (genital): 5.     Comments: Vaginal wall: pink and rugated, smooth and non-tender; absence of lesions, edema, and erythema. Labia Majora and Minora: present bilaterally, moist, soft tissue, and homogeneous; free of edema and ulcerations. Clitoris is anatomically present, above the urethral, and free of lesions, masses, and ulceration.    Neurological:     Mental Status: She is alert.     BP 138/88 (BP Location: Left Arm)   Pulse 79   Ht 5\' 7"  (1.702 m)   Wt 255 lb 0.6 oz (115.7 kg)   LMP 05/06/2016   SpO2 96%   BMI 39.94 kg/m  Wt Readings from Last 3  Encounters:  06/28/23 255 lb 0.6 oz (115.7 kg)  04/25/23 247 lb 1.9 oz (112.1 kg)  03/23/23 248 lb 0.6 oz (112.5 kg)    Lab Results  Component Value Date   TSH 1.740 06/21/2022   Lab Results  Component Value Date   WBC 6.5 06/21/2022   HGB 12.6 06/21/2022   HCT 41.0 06/21/2022   MCV 78 (L) 06/21/2022   PLT 374 06/21/2022   Lab Results  Component Value Date   NA 142 06/21/2022   K 3.8 06/21/2022   CO2 24 06/21/2022   GLUCOSE 73 06/21/2022   BUN 14 06/21/2022   CREATININE 1.10 (H) 06/21/2022   BILITOT 0.3 06/21/2022   ALKPHOS 91 06/21/2022   AST 30 06/21/2022   ALT 31 06/21/2022   PROT 8.1 06/21/2022   ALBUMIN 4.2 06/21/2022   CALCIUM 9.9 06/21/2022   ANIONGAP 9 01/02/2020   EGFR 60 06/21/2022   Lab Results  Component Value Date   CHOL 187 06/21/2022   Lab Results  Component Value Date   HDL 31 (L) 06/21/2022   Lab Results  Component Value Date   LDLCALC 128 (H) 06/21/2022   Lab Results  Component Value Date   TRIG 153 (H) 06/21/2022   Lab Results  Component Value Date   CHOLHDL 6.0 (H) 06/21/2022   Lab Results  Component Value Date   HGBA1C 6.4 (H) 06/21/2022      Assessment & Plan:  Cervical cancer screening Assessment & Plan: -Cytology and HPV co-testing (preferred) every 5 years  - Pap due 2029    Orders: -     Cytology - PAP  Primary hypertension Assessment & Plan:  Well-controlled The patient complains of a mild headache, which she rates as 5 out of 10. No red flags are reported, such as neck stiffness, nausea, vomiting, or signs of a severe headache (e.g., "the worst headache of her life"). She reports taking Tylenol with some relief. Encouraged to continue her treatment regimen.  Encouraged to seek immediate medical attention at the Emergency Department if she experience any of the following symptoms in conjunction with her headaches:  Acute onset of a severe headache described as "the worst headache of my life," particularly in a  patient with no prior history of headaches. Unrelenting headaches that do not improve with conservative treatments or pain that progressively worsens. Lancinating or "ice-pick" pain. Severe headaches accompanied by a stiff neck and/or fever. Headaches associated with changes in mental status or level of consciousness. Persistent headaches following trauma to the head or neck. A significant  change in pattern or severity of headaches in a patient with a longstanding history of chronic headaches. Your prompt evaluation is crucial for appropriate diagnosis and management.    Type 2 diabetes mellitus without complication, unspecified whether long term insulin use (HCC) Assessment & Plan: Pending labs  Orders: -     Hemoglobin A1c -     Microalbumin / creatinine urine ratio  Note: This chart has been completed using Engineer, civil (consulting) software, and while attempts have been made to ensure accuracy, certain words and phrases may not be transcribed as intended.    Follow-up: Return in about 3 months (around 09/26/2023).   Gilmore Laroche, FNP

## 2023-06-28 NOTE — Assessment & Plan Note (Signed)
Pending labs

## 2023-06-30 LAB — HEMOGLOBIN A1C
Est. average glucose Bld gHb Est-mCnc: 137 mg/dL
Hgb A1c MFr Bld: 6.4 % — ABNORMAL HIGH (ref 4.8–5.6)

## 2023-06-30 LAB — MICROALBUMIN / CREATININE URINE RATIO
Creatinine, Urine: 123.2 mg/dL
Microalb/Creat Ratio: 3357 mg/g{creat} — ABNORMAL HIGH (ref 0–29)
Microalbumin, Urine: 4135.4 ug/mL

## 2023-07-01 ENCOUNTER — Other Ambulatory Visit: Payer: Self-pay | Admitting: Family Medicine

## 2023-07-02 LAB — CYTOLOGY - PAP
Adequacy: ABSENT
Chlamydia: NEGATIVE
Comment: NEGATIVE
Comment: NEGATIVE
Comment: NEGATIVE
Comment: NORMAL
Diagnosis: NEGATIVE
High risk HPV: NEGATIVE
Neisseria Gonorrhea: NEGATIVE
Trichomonas: NEGATIVE

## 2023-07-18 NOTE — Progress Notes (Deleted)
 [facility] Initial Assessment:    Date:  07/18/2023   ID:  Laurie Brown, DOB 03-20-1966, MRN [id number]  PCP:  Zarwolo, Gloria, FNP  Cardiologist:  None  Nephrologist:  Referring MD: Zarwolo, Gloria, FNP   CC: Hypertension  History of Present Illness:    Laurie Brown is a 58 y.o. female with a hx of hypertension, HLD, DM2 *** here to establish care in the [facility].   Referred by PCP Zarwolo, Gloria, FNP due to persistent hypertension. Prior TSH 06/21/22 normal at 1.74. Labs 03/23/23 aldosterone 8.4, renin 0.228, aldos/renin ratio 36.8. As aldosterone <10, not consistent with hyperaldosteronism. Renal artery duplex and urine metanephrines ordered but not completed.   Laurie Brown was diagnosed with hypertension ***. It has been {Blank single:19197::easy,difficult,***} to control. Blood pressure {Blank single:19197::not checked routinely at home,checked with arm cuff at home,checked with wrist cuff at home,***}. Readings have been ***. she reports tobacco use ***. Alcohol use ***. For exercise she ***. she eats {Blank multiple:19196::***,at home,outside of the home} and {Blank single:19197::does,does not} follow low sodium diet.   ***NEED plasma metanephrines, ?cortisol, renal duplex  Previous antihypertensives: Telmisartan -Amlodipine  - difficulty with cost/availability  Secondary Causes of Hypertension  Medications/Herbal: OCP, steroids, stimulants, antidepressants, weight loss medication, immune suppressants, NSAIDs, sympathomimetics, alcohol, caffeine, licorice, ginseng, St. John's wort, chemo  Sleep Apnea Renal artery stenosis Pheochromocytoma: palpitations, tachycardia, headache, diaphoresis (plasma metanephrines) Cushing's syndrome: Cushingoid facies, central obesity, proximal muscle weakness, and ecchymoses, adrenal incidentaloma (cortisol) Coarctation of the aorta  Past Medical History:  Diagnosis Date    Diabetes mellitus without complication (HCC)    Hypertension     Past Surgical History:  Procedure Laterality Date   TUBAL LIGATION     TUBAL LIGATION      Current Medications: No outpatient medications have been marked as taking for the 07/19/23 encounter (Appointment) with Vannie Reche RAMAN, NP.     Allergies:   Lisinopril   Social History   Socioeconomic History   Marital status: Divorced    Spouse name: Not on file   Number of children: Not on file   Years of education: Not on file   Highest education level: Not on file  Occupational History   Not on file  Tobacco Use   Smoking status: Never   Smokeless tobacco: Never  Vaping Use   Vaping status: Never Used  Substance and Sexual Activity   Alcohol use: No   Drug use: No   Sexual activity: Yes    Birth control/protection: Surgical  Other Topics Concern   Not on file  Social History Narrative   Not on file   Social Drivers of Health   Financial Resource Strain: Not on file  Food Insecurity: Not on file  Transportation Needs: Not on file  Physical Activity: Not on file  Stress: Not on file  Social Connections: Not on file     Family History: The patient's ***family history is not on file.  ROS:   Please see the history of present illness.    *** All other systems reviewed and are negative.  EKGs/Labs/Other Studies Reviewed:         Recent Labs: No results found for requested labs within last 365 days.   Recent Lipid Panel    Component Value Date/Time   CHOL 187 06/21/2022 1501   TRIG 153 (H) 06/21/2022 1501   HDL 31 (L) 06/21/2022 1501   CHOLHDL 6.0 (H) 06/21/2022 1501   LDLCALC 128 (H) 06/21/2022 1501  Physical Exam:   VS:  LMP 05/06/2016  , BMI There is no height or weight on file to calculate BMI. GENERAL:  Well appearing HEENT: Pupils equal round and reactive, fundi not visualized, oral mucosa unremarkable NECK:  No jugular venous distention, waveform within normal limits, carotid  upstroke brisk and symmetric, no bruits, no thyromegaly LYMPHATICS:  No cervical adenopathy LUNGS:  Clear to auscultation bilaterally HEART:  RRR.  PMI not displaced or sustained,S1 and S2 within normal limits, no S3, no S4, no clicks, no rubs, *** murmurs ABD:  Flat, positive bowel sounds normal in frequency in pitch, no bruits, no rebound, no guarding, no midline pulsatile mass, no hepatomegaly, no splenomegaly EXT:  2 plus pulses throughout, no edema, no cyanosis no clubbing SKIN:  No rashes no nodules NEURO:  Cranial nerves II through XII grossly intact, motor grossly intact throughout PSYCH:  Cognitively intact, oriented to person place and time   ASSESSMENT/PLAN:    HTN -    Screening for Secondary Hypertension: { Click here to document screening for secondary causes of HTN  :[id number]}    Relevant Labs/Studies:    Latest Ref Rng & Units 06/21/2022    3:01 PM 04/03/2022   11:55 AM 12/22/2021    1:40 PM  Basic Labs  Sodium 134 - 144 mmol/L 142  141  144   Potassium 3.5 - 5.2 mmol/L 3.8  3.9  4.0   Creatinine 0.57 - 1.00 mg/dL 8.89  8.96  9.04        Latest Ref Rng & Units 06/21/2022    3:01 PM 04/03/2022   11:55 AM  Thyroid   TSH 0.450 - 4.500 uIU/mL 1.740  1.710        Latest Ref Rng & Units 03/23/2023    1:57 PM  Renin/Aldosterone   Aldosterone 0.0 - 30.0 ng/dL 8.4   Aldos/Renin Ratio 0.0 - 30.0 36.8                 Disposition:    FU with MD/APP/PharmD in {gen number 9-89:689602} {Days to years:10300}    Medication Adjustments/Labs and Tests Ordered: Current medicines are reviewed at length with the patient today.  Concerns regarding medicines are outlined above.  No orders of the defined types were placed in this encounter.  No orders of the defined types were placed in this encounter.    Signed, Reche GORMAN Finder, NP  07/18/2023 3:49 PM    [facility]

## 2023-07-19 ENCOUNTER — Institutional Professional Consult (permissible substitution) (HOSPITAL_BASED_OUTPATIENT_CLINIC_OR_DEPARTMENT_OTHER): Payer: No Typology Code available for payment source | Admitting: Family

## 2023-07-26 ENCOUNTER — Encounter (HOSPITAL_BASED_OUTPATIENT_CLINIC_OR_DEPARTMENT_OTHER): Payer: Self-pay

## 2023-08-01 ENCOUNTER — Other Ambulatory Visit: Payer: Self-pay | Admitting: Internal Medicine

## 2023-08-01 DIAGNOSIS — I10 Essential (primary) hypertension: Secondary | ICD-10-CM

## 2023-08-06 ENCOUNTER — Telehealth: Payer: Self-pay | Admitting: Family Medicine

## 2023-08-06 NOTE — Telephone Encounter (Unsigned)
Copied from CRM [id number]. Topic: Clinical - Prescription Issue >> Aug 06, 2023  4:39 PM Antony Haste wrote: Reason for CRM: This patient needs a refill for metoprolol tartrate (LOPRESSOR) 100 MG tablet, however it shows this has been discontinued as of 01/15 because transmission to pharmacy failed.

## 2023-08-07 ENCOUNTER — Other Ambulatory Visit: Payer: Self-pay | Admitting: Family Medicine

## 2023-08-07 NOTE — Telephone Encounter (Signed)
Contacted pt to inform of 6 refills on medication. She stated she was having trouble refilling thru [facility] app due to it giving error message to contact  provider. Instructed her to call in to [facility] to have refilled processed. Pt. Understood instructions and will call  back if she incurs further issues.

## 2023-08-07 NOTE — Telephone Encounter (Signed)
She has 6 refills on the med

## 2023-08-13 ENCOUNTER — Other Ambulatory Visit: Payer: Self-pay | Admitting: Family Medicine

## 2023-08-13 DIAGNOSIS — I10 Essential (primary) hypertension: Secondary | ICD-10-CM

## 2023-08-15 ENCOUNTER — Other Ambulatory Visit: Payer: Self-pay | Admitting: Family Medicine

## 2023-08-15 DIAGNOSIS — I10 Essential (primary) hypertension: Secondary | ICD-10-CM

## 2023-08-15 NOTE — Telephone Encounter (Signed)
Copied from CRM [id number]. Topic: Clinical - Medication Refill >> Aug 15, 2023  4:12 PM Geroge Baseman wrote: Most Recent Primary Care Visit:  Provider: Gilmore Laroche  Department: [facility]  Visit Type: OFFICE VISIT  Date: 06/28/2023  Medication: metoprolol tartrate (LOPRESSOR) 100 MG table  Has the patient contacted their pharmacy? Yes They do not have it call office  Is this the correct pharmacy for this prescription? Yes If no, delete pharmacy and type the correct one.  This is the patient's preferred pharmacy:  [id number] - [place], [place] - [address] [place] [address] Phone: [phone] Fax: [phone]     Has the prescription been filled recently? No  Is the patient out of the medication? Yes  Has the patient been seen for an appointment in the last year OR does the patient have an upcoming appointment? No  Can we respond through MyChart? Yes  Agent: Please be advised that Rx refills may take up to 3 business days. We ask that you follow-up with your pharmacy.

## 2023-08-16 ENCOUNTER — Other Ambulatory Visit: Payer: Self-pay | Admitting: Family Medicine

## 2023-08-16 DIAGNOSIS — I10 Essential (primary) hypertension: Secondary | ICD-10-CM

## 2023-08-16 NOTE — Telephone Encounter (Signed)
Copied from CRM [id number]. Topic: Clinical - Medication Refill >> Aug 15, 2023  4:12 PM Geroge Baseman wrote: Most Recent Primary Care Visit:  Provider: Gilmore Laroche  Department: [facility]  Visit Type: OFFICE VISIT  Date: 06/28/2023  Medication: metoprolol tartrate (LOPRESSOR) 100 MG table  Has the patient contacted their pharmacy? Yes They do not have it call office  Is this the correct pharmacy for this prescription? Yes If no, delete pharmacy and type the correct one.  This is the patient's preferred pharmacy:  [id number] - [place], [place] - [address] [place] [address] Phone: [phone] Fax: [phone]     Has the prescription been filled recently? No  Is the patient out of the medication? Yes  Has the patient been seen for an appointment in the last year OR does the patient have an upcoming appointment? No  Can we respond through MyChart? Yes  Agent: Please be advised that Rx refills may take up to 3 business days. We ask that you follow-up with your pharmacy.

## 2023-08-27 ENCOUNTER — Other Ambulatory Visit: Payer: Self-pay | Admitting: Family Medicine

## 2023-08-27 DIAGNOSIS — I10 Essential (primary) hypertension: Secondary | ICD-10-CM

## 2023-08-27 NOTE — Telephone Encounter (Signed)
 Copied from CRM [id number]. Topic: Clinical - Medication Refill >> Aug 27, 2023 12:08 PM Evie Hoff wrote: Most Recent Primary Care Visit:  Provider: Junnie Olives  Department: [facility]  Visit Type: OFFICE VISIT  Date: 06/28/2023  Medication: metoprolol  tartrate (LOPRESSOR ) 100 MG tablet   Has the patient contacted their pharmacy? Yes dont have the prescription (Agent: If no, request that the patient contact the pharmacy for the refill. If patient does not wish to contact the pharmacy document the reason why and proceed with request.) (Agent: If yes, when and what did the pharmacy advise?)  Is this the correct pharmacy for this prescription? Yes If no, delete pharmacy and type the correct one.  This is the patient's preferred pharmacy:  [id number] - [place], [place] - [address] [place] [address] Phone: [phone] Fax: [phone]  [email or web address] - [place], [place] - [address] [place] [address] Phone: [phone] Fax: [phone]  [facility] [address] [place] - [address] [place] [address] [place] [address] [place] [address] Phone: [phone] Fax: [phone]  [place] [facility] [address] [place] [address] Phone: [phone] Fax: [phone]   Has the prescription been filled recently? No  Is the patient out of the medication? Yes  Has the patient been seen for an appointment in the last year OR does the patient have an upcoming appointment? Yes  Can we respond through MyChart? Yes  Agent: Please be advised that Rx refills may take up to 3 business days. We ask that you follow-up with your pharmacy.

## 2023-09-10 ENCOUNTER — Other Ambulatory Visit: Payer: Self-pay | Admitting: Family Medicine

## 2023-09-10 DIAGNOSIS — I10 Essential (primary) hypertension: Secondary | ICD-10-CM

## 2023-09-10 NOTE — Telephone Encounter (Signed)
 Last Fill: 08/01/23-transmission to pharmacy failed per note  Last OV: 06/28/23 Next OV: 10/05/23  Routing to provider for review/authorization.

## 2023-09-10 NOTE — Telephone Encounter (Signed)
 Copied from CRM [id number]. Topic: Clinical - Medication Refill >> Sep 10, 2023  2:51 PM Carlatta H wrote: Most Recent Primary Care Visit:  Provider: Gilmore Laroche  Department: [facility]  Visit Type: OFFICE VISIT  Date: 06/28/2023  Medication: metoprolol tartrate (LOPRESSOR) 100 MG tablet [[id number]]  Has the patient contacted their pharmacy? No (Agent: If no, request that the patient contact the pharmacy for the refill. If patient does not wish to contact the pharmacy document the reason why and proceed with request.) (Agent: If yes, when and what did the pharmacy advise?)  Is this the correct pharmacy for this prescription? Yes If no, delete pharmacy and type the correct one.  This is the patient's preferred pharmacy:  [id number] - [place], [place] - [address] [place] [address] Phone: [phone] Fax: [phone]      Has the prescription been filled recently? No  Is the patient out of the medication? Yes  Has the patient been seen for an appointment in the last year OR does the patient have an upcoming appointment? Yes  Can we respond through MyChart? No  Agent: Please be advised that Rx refills may take up to 3 business days. We ask that you follow-up with your pharmacy.

## 2023-09-26 ENCOUNTER — Other Ambulatory Visit: Payer: Self-pay | Admitting: Family Medicine

## 2023-09-26 DIAGNOSIS — E119 Type 2 diabetes mellitus without complications: Secondary | ICD-10-CM

## 2023-10-05 ENCOUNTER — Ambulatory Visit (INDEPENDENT_AMBULATORY_CARE_PROVIDER_SITE_OTHER): Payer: MEDICAID | Admitting: Family Medicine

## 2023-10-05 ENCOUNTER — Encounter: Payer: Self-pay | Admitting: Family Medicine

## 2023-10-05 VITALS — BP 188/113 | HR 92 | Ht 67.0 in | Wt 251.8 lb

## 2023-10-05 DIAGNOSIS — E1169 Type 2 diabetes mellitus with other specified complication: Secondary | ICD-10-CM

## 2023-10-05 DIAGNOSIS — E785 Hyperlipidemia, unspecified: Secondary | ICD-10-CM

## 2023-10-05 DIAGNOSIS — E559 Vitamin D deficiency, unspecified: Secondary | ICD-10-CM

## 2023-10-05 DIAGNOSIS — E7849 Other hyperlipidemia: Secondary | ICD-10-CM

## 2023-10-05 DIAGNOSIS — I10 Essential (primary) hypertension: Secondary | ICD-10-CM | POA: Diagnosis not present

## 2023-10-05 MED ORDER — METOPROLOL TARTRATE 100 MG PO TABS
ORAL_TABLET | ORAL | 6 refills | Status: AC
Start: 2023-10-05 — End: ?

## 2023-10-05 MED ORDER — ROSUVASTATIN CALCIUM 10 MG PO TABS
10.0000 mg | ORAL_TABLET | Freq: Every day | ORAL | 0 refills | Status: AC
Start: 2023-10-05 — End: ?

## 2023-10-05 MED ORDER — VITAMIN D (ERGOCALCIFEROL) 1.25 MG (50000 UNIT) PO CAPS
50000.0000 [IU] | ORAL_CAPSULE | ORAL | 2 refills | Status: AC
Start: 2023-10-05 — End: ?

## 2023-10-05 MED ORDER — HYDROCHLOROTHIAZIDE 12.5 MG PO TABS
12.5000 mg | ORAL_TABLET | Freq: Every day | ORAL | 3 refills | Status: AC
Start: 2023-10-05 — End: ?

## 2023-10-05 MED ORDER — HYDRALAZINE HCL 10 MG PO TABS
10.0000 mg | ORAL_TABLET | Freq: Every day | ORAL | 1 refills | Status: DC
Start: 2023-10-05 — End: 2023-10-29

## 2023-10-05 MED ORDER — OLMESARTAN MEDOXOMIL 20 MG PO TABS
20.0000 mg | ORAL_TABLET | Freq: Every day | ORAL | 1 refills | Status: DC
Start: 2023-10-05 — End: 2023-11-27

## 2023-10-05 NOTE — Progress Notes (Signed)
 Established Patient Office Visit  Subjective:  Patient ID: Laurie Brown, female    DOB: 10-31-1965  Age: 58 y.o. MRN: [id number]  CC:  Chief Complaint  Patient presents with   Care Management    HPI Laurie Brown is a 58 y.o. female presents for a hypertension follow-up, noting that she has not been able to pick up her blood pressure medications for the past 3 months from the pharmacy due to them being out of stock. She is currently asymptomatic in the clinic.  Past Medical History:  Diagnosis Date   Diabetes mellitus without complication (HCC)    Hypertension     Past Surgical History:  Procedure Laterality Date   TUBAL LIGATION     TUBAL LIGATION      No family history on file.  Social History   Socioeconomic History   Marital status: Divorced    Spouse name: Not on file   Number of children: Not on file   Years of education: Not on file   Highest education level: Not on file  Occupational History   Not on file  Tobacco Use   Smoking status: Never   Smokeless tobacco: Never  Vaping Use   Vaping status: Never Used  Substance and Sexual Activity   Alcohol use: No   Drug use: No   Sexual activity: Yes    Birth control/protection: Surgical  Other Topics Concern   Not on file  Social History Narrative   Not on file   Social Drivers of Health   Financial Resource Strain: Not on file  Food Insecurity: Not on file  Transportation Needs: Not on file  Physical Activity: Not on file  Stress: Not on file  Social Connections: Not on file  Intimate Partner Violence: Not on file    Outpatient Medications Prior to Visit  Medication Sig Dispense Refill   JARDIANCE 10 MG TABS tablet TAKE 1 TABLET(10 MG) BY MOUTH DAILY 30 tablet 2   hydrALAZINE (APRESOLINE) 10 MG tablet Take 1 tablet (10 mg total) by mouth daily. 90 tablet 1   losartan (COZAAR) 25 MG tablet TAKE 1 TABLET BY MOUTH DAILY 90 tablet 0   ibuprofen (ADVIL) 800 MG tablet Take 1 tablet (800 mg total)  by mouth every 8 (eight) hours as needed for fever, moderate pain or mild pain. 21 tablet 0   metoprolol tartrate (LOPRESSOR) 100 MG tablet TAKE 1 TABLET(100 MG) BY MOUTH TWICE DAILY 30 tablet 6   rosuvastatin (CRESTOR) 10 MG tablet TAKE 1 TABLET(10 MG) BY MOUTH DAILY 90 tablet 0   Vitamin D, Ergocalciferol, (DRISDOL) 1.25 MG (50000 UNIT) CAPS capsule Take 1 capsule (50,000 Units total) by mouth every 7 (seven) days. 7 capsule 2   No facility-administered medications prior to visit.    Allergies  Allergen Reactions   Lisinopril Cough    ROS Review of Systems  Constitutional:  Negative for chills and fever.  Eyes:  Negative for visual disturbance.  Respiratory:  Negative for chest tightness and shortness of breath.   Neurological:  Negative for dizziness and headaches.      Objective:    Physical Exam HENT:     Head: Normocephalic.     Mouth/Throat:     Mouth: Mucous membranes are moist.  Cardiovascular:     Rate and Rhythm: Normal rate.     Heart sounds: Normal heart sounds.  Pulmonary:     Effort: Pulmonary effort is normal.     Breath sounds: Normal breath sounds.  Neurological:     Mental Status: She is alert.     BP (!) 188/113 (BP Location: Right Arm, Patient Position: Sitting, Cuff Size: Large)   Pulse 92   Ht 5\' 7"  (1.702 m)   Wt 251 lb 12.8 oz (114.2 kg)   LMP 05/06/2016   SpO2 93%   BMI 39.44 kg/m  Wt Readings from Last 3 Encounters:  10/05/23 251 lb 12.8 oz (114.2 kg)  06/28/23 255 lb 0.6 oz (115.7 kg)  04/25/23 247 lb 1.9 oz (112.1 kg)    Lab Results  Component Value Date   TSH 1.740 06/21/2022   Lab Results  Component Value Date   WBC 6.5 06/21/2022   HGB 12.6 06/21/2022   HCT 41.0 06/21/2022   MCV 78 (L) 06/21/2022   PLT 374 06/21/2022   Lab Results  Component Value Date   NA 142 06/21/2022   K 3.8 06/21/2022   CO2 24 06/21/2022   GLUCOSE 73 06/21/2022   BUN 14 06/21/2022   CREATININE 1.10 (H) 06/21/2022   BILITOT 0.3 06/21/2022    ALKPHOS 91 06/21/2022   AST 30 06/21/2022   ALT 31 06/21/2022   PROT 8.1 06/21/2022   ALBUMIN 4.2 06/21/2022   CALCIUM 9.9 06/21/2022   ANIONGAP 9 01/02/2020   EGFR 60 06/21/2022   Lab Results  Component Value Date   CHOL 187 06/21/2022   Lab Results  Component Value Date   HDL 31 (L) 06/21/2022   Lab Results  Component Value Date   LDLCALC 128 (H) 06/21/2022   Lab Results  Component Value Date   TRIG 153 (H) 06/21/2022   Lab Results  Component Value Date   CHOLHDL 6.0 (H) 06/21/2022   Lab Results  Component Value Date   HGBA1C 6.4 (H) 06/28/2023      Assessment & Plan:  Primary hypertension Assessment & Plan: The patient presents with uncontrolled blood pressure in the clinic today. Her pharmacy has been updated, and new prescriptions will be sent accordingly. She is encouraged to begin taking olmesartan 20 mg daily, metoprolol 100 mg twice daily, hydrochlorothiazide 12.5 mg daily, and hydralazine 10 mg daily. The patient is advised to monitor her blood pressure daily and to withhold her blood pressure medications if her readings are less than 90/60 mmHg. A low-sodium diet and increased physical activity are also encouraged to support blood pressure control. BP Readings from Last 3 Encounters:  10/05/23 (!) 188/113  06/28/23 138/88  04/25/23 103/78      Orders: -     Metoprolol Tartrate; TAKE 1 TABLET(100 MG) BY MOUTH TWICE DAILY  Dispense: 30 tablet; Refill: 6 -     Olmesartan Medoxomil; Take 1 tablet (20 mg total) by mouth daily.  Dispense: 30 tablet; Refill: 1 -     hydroCHLOROthiazide; Take 1 tablet (12.5 mg total) by mouth daily.  Dispense: 30 tablet; Refill: 3 -     hydrALAZINE HCl; Take 1 tablet (10 mg total) by mouth daily.  Dispense: 90 tablet; Refill: 1  Hyperlipidemia associated with type 2 diabetes mellitus (HCC) Assessment & Plan: The patient was encouraged to continue taking Rosuvastatin 20 mg daily for cholesterol management. Lifestyle  modifications were also discussed, including avoiding simple carbohydrates such as cakes, sweet desserts, ice cream, soda (diet or regular), sweet tea, candies, chips, cookies, store-bought juices, excessive alcohol (more than 1-2 drinks per day), lemonade, artificial sweeteners, donuts, coffee creamers, and sugar-free products. Additionally, the patient was advised to reduce the consumption of greasy, fatty foods  and increase physical activity to support cardiovascular health. The patient verbalized understanding and is aware of the plan of care.  Lab Results  Component Value Date   CHOL 187 06/21/2022   HDL 31 (L) 06/21/2022   LDLCALC 128 (H) 06/21/2022   TRIG 153 (H) 06/21/2022   CHOLHDL 6.0 (H) 06/21/2022     Orders: -     Rosuvastatin Calcium; Take 1 tablet (10 mg total) by mouth daily.  Dispense: 90 tablet; Refill: 0  Vitamin D deficiency Assessment & Plan: Encouraged to increase his intake of vitamin D-rich foods such as fatty fish (e.g., salmon, mackerel, and sardines), fortified dairy products, egg yolks, and fortified cereals.   Orders: -     Vitamin D (Ergocalciferol); Take 1 capsule (50,000 Units total) by mouth every 7 (seven) days.  Dispense: 20 capsule; Refill: 2  Note: This chart has been completed using Engineer, civil (consulting) software, and while attempts have been made to ensure accuracy, certain words and phrases may not be transcribed as intended.    Follow-up: Return in about 1 month (around 11/05/2023).   Gilmore Laroche, FNP

## 2023-10-05 NOTE — Patient Instructions (Addendum)
 I appreciate the opportunity to provide care to you today!    Follow up:  1 month for BP  Hypertension Management  Your current blood pressure is above the target goal of <140/90 mmHg. To address this, please start taking olmesartan 20 mg daily, metoprolol 100 mg BID, hydrochlorothiazide 12.5 mg daily and hydralazine 10 mg daily.  Please check your BP daily and do not take your BP medication if your blood pressure is  < 90/60  Medication Instructions: Take your blood pressure medication at the same time each day. After taking your medication, check your blood pressure at least an hour later. If your first reading is >140/90 mmHg, wait at least 10 minutes and recheck your blood pressure. Side Effects: In the initial days of therapy, you may experience dizziness or lightheadedness as your body adjusts to the lower blood pressure; this is expected. Diet and Lifestyle: Adhere to a low-sodium diet, limiting intake to less than 2300 mg daily, and increase your physical activity. Avoid over-the-counter NSAIDs such as ibuprofen and naproxen while on this medication. Hydration and Nutrition: Stay well-hydrated by drinking at least 64 ounces of water daily. Increase your servings of fruits and vegetables and avoid excessive sodium in your diet. Long-Term Considerations: Uncontrolled hypertension can increase the risk of cardiovascular diseases, including stroke, coronary artery disease, and heart failure.  Please report to the emergency department if your blood pressure exceeds 180/120 and is accompanied by symptoms such as headaches, chest pain, palpitations, blurred vision, or dizziness.   Attached with your AVS, you will find valuable resources for self-education. I highly recommend dedicating some time to thoroughly examine them.   Please continue to a heart-healthy diet and increase your physical activities. Try to exercise for at least five days a week.    It was a pleasure to see you and I  look forward to continuing to work together on your health and well-being. Please do not hesitate to call the office if you need care or have questions about your care.  In case of emergency, please visit the Emergency Department for urgent care, or contact our clinic at [phone] to schedule an appointment. We're here to help you!   Have a wonderful day and week. With Gratitude, Gilmore Laroche MSN, FNP-BC

## 2023-10-06 DIAGNOSIS — E559 Vitamin D deficiency, unspecified: Secondary | ICD-10-CM | POA: Insufficient documentation

## 2023-10-06 NOTE — Assessment & Plan Note (Signed)
 Encouraged to increase his intake of vitamin D-rich foods such as fatty fish (e.g., salmon, mackerel, and sardines), fortified dairy products, egg yolks, and fortified cereals.

## 2023-10-06 NOTE — Assessment & Plan Note (Signed)
 The patient was encouraged to continue taking Rosuvastatin 20 mg daily for cholesterol management. Lifestyle modifications were also discussed, including avoiding simple carbohydrates such as cakes, sweet desserts, ice cream, soda (diet or regular), sweet tea, candies, chips, cookies, store-bought juices, excessive alcohol (more than 1-2 drinks per day), lemonade, artificial sweeteners, donuts, coffee creamers, and sugar-free products. Additionally, the patient was advised to reduce the consumption of greasy, fatty foods and increase physical activity to support cardiovascular health. The patient verbalized understanding and is aware of the plan of care.  Lab Results  Component Value Date   CHOL 187 06/21/2022   HDL 31 (L) 06/21/2022   LDLCALC 128 (H) 06/21/2022   TRIG 153 (H) 06/21/2022   CHOLHDL 6.0 (H) 06/21/2022

## 2023-10-06 NOTE — Assessment & Plan Note (Addendum)
 The patient presents with uncontrolled blood pressure in the clinic today. Her pharmacy has been updated, and new prescriptions will be sent accordingly. She is encouraged to begin taking olmesartan 20 mg daily, metoprolol 100 mg twice daily, hydrochlorothiazide 12.5 mg daily, and hydralazine 10 mg daily. The patient is advised to monitor her blood pressure daily and to withhold her blood pressure medications if her readings are less than 90/60 mmHg. A low-sodium diet and increased physical activity are also encouraged to support blood pressure control. BP Readings from Last 3 Encounters:  10/05/23 (!) 188/113  06/28/23 138/88  04/25/23 103/78

## 2023-10-28 ENCOUNTER — Other Ambulatory Visit: Payer: Self-pay | Admitting: Family Medicine

## 2023-10-28 DIAGNOSIS — I10 Essential (primary) hypertension: Secondary | ICD-10-CM

## 2023-11-19 ENCOUNTER — Other Ambulatory Visit (HOSPITAL_COMMUNITY): Payer: Self-pay | Admitting: Family Medicine

## 2023-11-19 DIAGNOSIS — Z1231 Encounter for screening mammogram for malignant neoplasm of breast: Secondary | ICD-10-CM

## 2023-11-26 ENCOUNTER — Encounter (HOSPITAL_COMMUNITY): Payer: Self-pay

## 2023-11-27 ENCOUNTER — Other Ambulatory Visit: Payer: Self-pay | Admitting: Family Medicine

## 2023-11-27 DIAGNOSIS — I10 Essential (primary) hypertension: Secondary | ICD-10-CM

## 2023-12-03 ENCOUNTER — Inpatient Hospital Stay (HOSPITAL_COMMUNITY): Admission: RE | Admit: 2023-12-03 | Source: Ambulatory Visit

## 2023-12-03 DIAGNOSIS — Z1231 Encounter for screening mammogram for malignant neoplasm of breast: Secondary | ICD-10-CM

## 2023-12-20 ENCOUNTER — Other Ambulatory Visit: Payer: Self-pay | Admitting: Family Medicine

## 2023-12-20 DIAGNOSIS — E119 Type 2 diabetes mellitus without complications: Secondary | ICD-10-CM

## 2023-12-24 ENCOUNTER — Ambulatory Visit: Admitting: Family Medicine

## 2023-12-24 ENCOUNTER — Other Ambulatory Visit: Payer: Self-pay | Admitting: Family Medicine

## 2023-12-24 DIAGNOSIS — I10 Essential (primary) hypertension: Secondary | ICD-10-CM

## 2024-01-02 ENCOUNTER — Other Ambulatory Visit: Payer: Self-pay

## 2024-01-02 DIAGNOSIS — I10 Essential (primary) hypertension: Secondary | ICD-10-CM

## 2024-01-02 MED ORDER — HYDROCHLOROTHIAZIDE 12.5 MG PO TABS: 12.5000 mg | ORAL_TABLET | Freq: Every day | ORAL | 3 refills | Status: DC

## 2024-01-02 MED ORDER — METOPROLOL TARTRATE 100 MG PO TABS: 100.0000 mg | ORAL_TABLET | Freq: Two times a day (BID) | ORAL | 0 refills | Status: DC

## 2024-01-03 ENCOUNTER — Ambulatory Visit

## 2024-01-10 ENCOUNTER — Ambulatory Visit

## 2024-01-10 DIAGNOSIS — E119 Type 2 diabetes mellitus without complications: Secondary | ICD-10-CM

## 2024-01-10 LAB — HM DIABETES EYE EXAM

## 2024-01-10 NOTE — Progress Notes (Signed)
 Arrived 01/10/2024 and has given verbal consent to obtain images and complete their overdue diabetic retinal screening.  The images have been sent to an ophthalmologist or optometrist for review and interpretation.  Results will be sent back to [facility] for review.  Patient has been informed they will be contacted when we receive the results via telephone or MyChart.

## 2024-01-14 ENCOUNTER — Telehealth: Payer: Self-pay | Admitting: Family Medicine

## 2024-01-14 NOTE — Telephone Encounter (Signed)
 Kindly place a referral to optometry for ongoing diabetic eye exams. Please advise the patient to maintain a blood pressure of less than 140/90 mmHg and an HbA1c below 7.0. to help prevent progression of diabetic retinopathy.

## 2024-01-14 NOTE — Telephone Encounter (Signed)
 Eye exam results in your box. Pt positive for mild diabetic retiniopathy. Pls advise your recommendations and result notes so she can be contacted and results reviewed with her.

## 2024-01-15 ENCOUNTER — Other Ambulatory Visit: Payer: Self-pay | Admitting: Family Medicine

## 2024-01-15 DIAGNOSIS — E113293 Type 2 diabetes mellitus with mild nonproliferative diabetic retinopathy without macular edema, bilateral: Secondary | ICD-10-CM

## 2024-01-17 ENCOUNTER — Other Ambulatory Visit: Payer: Self-pay | Admitting: Family Medicine

## 2024-01-17 DIAGNOSIS — Z135 Encounter for screening for eye and ear disorders: Secondary | ICD-10-CM

## 2024-02-18 ENCOUNTER — Ambulatory Visit (INDEPENDENT_AMBULATORY_CARE_PROVIDER_SITE_OTHER): Admitting: Family Medicine

## 2024-02-18 VITALS — BP 180/104 | HR 76 | Resp 16 | Ht 67.0 in | Wt 255.1 lb

## 2024-02-18 DIAGNOSIS — I10 Essential (primary) hypertension: Secondary | ICD-10-CM

## 2024-02-18 MED ORDER — OLMESARTAN MEDOXOMIL 20 MG PO TABS: 20.0000 mg | ORAL_TABLET | Freq: Every day | ORAL | 0 refills | Status: DC

## 2024-02-18 MED ORDER — HYDRALAZINE HCL 10 MG PO TABS
10.0000 mg | ORAL_TABLET | Freq: Two times a day (BID) | ORAL | 1 refills | Status: DC
Start: 2024-02-18 — End: 2024-03-21

## 2024-02-18 NOTE — Patient Instructions (Addendum)
 I appreciate the opportunity to provide care to you today!    Follow up:  1 months  Hypertension Management  Your current blood pressure is above the target goal of <140/90 mmHg. To help bring it under control, please start taking Hydralazine  10 mg twice daily (BID) and Olmesartan  20 mg once daily, in addition to your current regimen of Metoprolol  100 mg twice daily and Hydrochlorothiazide  12.5 mg once daily.   Medication Instructions: Take your blood pressure medication at the same time each day. After taking your medication, check your blood pressure at least an hour later. If your first reading is >140/90 mmHg, wait at least 10 minutes and recheck your blood pressure. Side Effects: In the initial days of therapy, you may experience dizziness or lightheadedness as your body adjusts to the lower blood pressure; this is expected. Diet and Lifestyle: Adhere to a low-sodium diet, limiting intake to less than 1500 mg daily, and increase your physical activity. Avoid over-the-counter NSAIDs such as ibuprofen  and naproxen while on this medication. Hydration and Nutrition: Stay well-hydrated by drinking at least 64 ounces of water daily. Increase your servings of fruits and vegetables and avoid excessive sodium in your diet. Long-Term Considerations: Uncontrolled hypertension can increase the risk of cardiovascular diseases, including stroke, coronary artery disease, and heart failure.  Please report to the emergency department if your blood pressure exceeds 180/120 and is accompanied by symptoms such as headaches, chest pain, palpitations, blurred vision, or dizziness.   Please follow up if your symptoms worsen or fail to improve.   Attached with your AVS, you will find valuable resources for self-education. I highly recommend dedicating some time to thoroughly examine them.   Please continue to a heart-healthy diet and increase your physical activities. Try to exercise for at least five  days a week.    It was a pleasure to see you and I look forward to continuing to work together on your health and well-being. Please do not hesitate to call the office if you need care or have questions about your care.  In case of emergency, please visit the Emergency Department for urgent care, or contact our clinic at [phone] to schedule an appointment. We're here to help you!   Have a wonderful day and week. With Gratitude, Ines Warf MSN, FNP-BC

## 2024-02-18 NOTE — Progress Notes (Unsigned)
 Established Patient Office Visit  Subjective:  Patient ID: Laurie Brown, female    DOB: [birth year]  Age: 58 y.o. MRN: [id number]  CC: No chief complaint on file.   HPI Laurie Brown is a 58 y.o. female with past medical history of *** presents for f/u of *** chronic medical conditions.  Past Medical History:  Diagnosis Date   Diabetes mellitus without complication (HCC)    Hypertension     Past Surgical History:  Procedure Laterality Date   TUBAL LIGATION     TUBAL LIGATION      No family history on file.  Social History   Socioeconomic History   Marital status: Divorced    Spouse name: Not on file   Number of children: Not on file   Years of education: Not on file   Highest education level: Not on file  Occupational History   Not on file  Tobacco Use   Smoking status: Never   Smokeless tobacco: Never  Vaping Use   Vaping status: Never Used  Substance and Sexual Activity   Alcohol use: No   Drug use: No   Sexual activity: Yes    Birth control/protection: Surgical  Other Topics Concern   Not on file  Social History Narrative   Not on file   Social Drivers of Health   Financial Resource Strain: Not on file  Food Insecurity: Not on file  Transportation Needs: Not on file  Physical Activity: Not on file  Stress: Not on file  Social Connections: Not on file  Intimate Partner Violence: Not on file    Outpatient Medications Prior to Visit  Medication Sig Dispense Refill   hydrochlorothiazide  (HYDRODIURIL ) 12.5 MG tablet Take 1 tablet (12.5 mg total) by mouth daily. 30 tablet 3   JARDIANCE  10 MG TABS tablet TAKE 1 TABLET(10 MG) BY MOUTH DAILY 30 tablet 2   metoprolol  tartrate (LOPRESSOR ) 100 MG tablet Take 1 tablet (100 mg total) by mouth 2 (two) times daily. TAKE 1 TABLET(100 MG) BY MOUTH TWICE DAILY 180 tablet 0   rosuvastatin  (CRESTOR ) 10 MG tablet Take 1 tablet (10 mg total) by mouth daily. 90 tablet 0   Vitamin D , Ergocalciferol , (DRISDOL ) 1.25 MG  (50000 UNIT) CAPS capsule Take 1 capsule (50,000 Units total) by mouth every 7 (seven) days. 20 capsule 2   hydrALAZINE  (APRESOLINE ) 10 MG tablet TAKE 1 TABLET(10 MG) BY MOUTH DAILY (Patient not taking: Reported on 02/18/2024) 90 tablet 1   ibuprofen  (ADVIL ) 800 MG tablet Take 1 tablet (800 mg total) by mouth every 8 (eight) hours as needed for fever, moderate pain or mild pain. 21 tablet 0   olmesartan  (BENICAR ) 20 MG tablet Take 1 tablet by mouth once daily (Patient not taking: Reported on 02/18/2024) 30 tablet 0   No facility-administered medications prior to visit.    Allergies  Allergen Reactions   Lisinopril Cough    ROS Review of Systems    Objective:    Physical Exam  BP (!) 180/104   Pulse 76   Ht 5' 7 (1.702 m)   Wt 255 lb 1.9 oz (115.7 kg)   LMP 05/06/2016   BMI 39.96 kg/m  Wt Readings from Last 3 Encounters:  02/18/24 255 lb 1.9 oz (115.7 kg)  10/05/23 251 lb 12.8 oz (114.2 kg)  06/28/23 255 lb 0.6 oz (115.7 kg)    Lab Results  Component Value Date   TSH 1.740 06/21/2022   Lab Results  Component Value Date   WBC  6.5 06/21/2022   HGB 12.6 06/21/2022   HCT 41.0 06/21/2022   MCV 78 (L) 06/21/2022   PLT 374 06/21/2022   Lab Results  Component Value Date   NA 142 06/21/2022   K 3.8 06/21/2022   CO2 24 06/21/2022   GLUCOSE 73 06/21/2022   BUN 14 06/21/2022   CREATININE 1.10 (H) 06/21/2022   BILITOT 0.3 06/21/2022   ALKPHOS 91 06/21/2022   AST 30 06/21/2022   ALT 31 06/21/2022   PROT 8.1 06/21/2022   ALBUMIN 4.2 06/21/2022   CALCIUM  9.9 06/21/2022   ANIONGAP 9 01/02/2020   EGFR 60 06/21/2022   Lab Results  Component Value Date   CHOL 187 06/21/2022   Lab Results  Component Value Date   HDL 31 (L) 06/21/2022   Lab Results  Component Value Date   LDLCALC 128 (H) 06/21/2022   Lab Results  Component Value Date   TRIG 153 (H) 06/21/2022   Lab Results  Component Value Date   CHOLHDL 6.0 (H) 06/21/2022   Lab Results  Component Value Date    HGBA1C 6.4 (H) 06/28/2023      Assessment & Plan:  There are no diagnoses linked to this encounter.  Follow-up: No follow-ups on file.   Katie Faraone, FNP

## 2024-02-19 NOTE — Assessment & Plan Note (Signed)
 The patient presents for follow-up and was found to have uncontrolled blood pressure in the clinic today. Upon review of her medication adherence, she reports not taking Hydralazine  10 mg twice daily and Olmesartan  20 mg once daily as prescribed.  The patient was strongly encouraged to resume and adhere to her full antihypertensive regimen, which includes: Hydralazine  10 mg BID Olmesartan  20 mg daily Metoprolol  100 mg BID Hydrochlorothiazide  12.5 mg daily Education was provided on the importance of medication compliance to achieve optimal blood pressure control. A low-sodium diet of less than 2,300 mg daily is recommended, along with moderate-intensity physical activity for at least 150 minutes per week. The patient is encouraged to maintain these lifestyle modifications to help manage her blood pressure effectively.  Long-term considerations were discussed, emphasizing that uncontrolled hypertension increases the risk of cardiovascular diseases, including stroke, coronary artery disease, and heart failure.  The patient is encouraged to seek emergency care if blood pressure exceeds 180/120 and is accompanied by symptoms such as headaches, chest pain, palpitations, blurred vision, or dizziness. She verbalized understanding and will follow up as scheduled.

## 2024-03-10 ENCOUNTER — Encounter: Payer: Self-pay | Admitting: Family Medicine

## 2024-03-10 NOTE — Telephone Encounter (Signed)
 Encouraged to continue treatment regimen until her follow up visit

## 2024-03-18 ENCOUNTER — Encounter: Payer: Self-pay | Admitting: Family Medicine

## 2024-03-21 ENCOUNTER — Other Ambulatory Visit: Payer: Self-pay | Admitting: Family Medicine

## 2024-03-21 DIAGNOSIS — I10 Essential (primary) hypertension: Secondary | ICD-10-CM

## 2024-03-21 MED ORDER — NIFEDIPINE ER OSMOTIC RELEASE 30 MG PO TB24: 30.0000 mg | ORAL_TABLET | Freq: Every day | ORAL | 0 refills | Status: DC

## 2024-03-27 ENCOUNTER — Encounter: Payer: Self-pay | Admitting: Family Medicine

## 2024-03-27 ENCOUNTER — Ambulatory Visit (INDEPENDENT_AMBULATORY_CARE_PROVIDER_SITE_OTHER): Admitting: Family Medicine

## 2024-03-27 VITALS — BP 210/120 | HR 79 | Resp 16 | Ht 67.0 in | Wt 256.1 lb

## 2024-03-27 DIAGNOSIS — I10 Essential (primary) hypertension: Secondary | ICD-10-CM

## 2024-03-27 MED ORDER — AMLODIPINE-OLMESARTAN 10-40 MG PO TABS: 1.0000 | ORAL_TABLET | Freq: Every day | ORAL | 3 refills | Status: AC

## 2024-03-27 NOTE — Assessment & Plan Note (Signed)
 Patient presents with uncontrolled blood pressure in clinic but remains asymptomatic. She reports discontinuing Procardia  (nifedipine ) 30 mg daily due to side effects. Admits to unhealthy eating habits but endorses compliance with Metoprolol  100 mg BID, Olmesartan  20 mg daily, and Hydrochlorothiazide  12.5 mg daily. Treatment regimen was adjusted today in clinic: Olmesartan -amlodipine  40-10 mg daily Metoprolol  100 mg BID Hydrochlorothiazide  12.5 mg daily Referral placed to Cardiology for collaborative care. Referral placed to Pharmacy for medication adherence support, as adherence to current regimen remains uncertain. Lifestyle modifications reinforced: low-sodium diet and increased physical activity encouraged.

## 2024-03-27 NOTE — Patient Instructions (Addendum)
 I appreciate the opportunity to provide care to you today!    Follow up:  1 months Nurse visit to reassess BP  Labs: please stop by the lab today to get your blood drawn (BMP)  Hypertension Management  Your current blood pressure is above the target goal of <140/90 mmHg. To address this, please start taking Olmesartan -amlodipine  40-10 mg daily and metoprolol  100 mg BID and Hydrochlorothiazide  12.5 mg daily  Medication Instructions: Take your blood pressure medication at the same time each day. After taking your medication, check your blood pressure at least an hour later. If your first reading is >140/90 mmHg, wait at least 10 minutes and recheck your blood pressure. Side Effects: In the initial days of therapy, you may experience dizziness or lightheadedness as your body adjusts to the lower blood pressure; this is expected. Diet and Lifestyle: Adhere to a low-sodium diet, limiting intake to less than 1500 mg daily, and increase your physical activity. Avoid over-the-counter NSAIDs such as ibuprofen  and naproxen while on this medication. Hydration and Nutrition: Stay well-hydrated by drinking at least 64 ounces of water daily. Increase your servings of fruits and vegetables and avoid excessive sodium in your diet. Long-Term Considerations: Uncontrolled hypertension can increase the risk of cardiovascular diseases, including stroke, coronary artery disease, and heart failure.  Please report to the emergency department if your blood pressure exceeds 180/120 and is accompanied by symptoms such as headaches, chest pain, palpitations, blurred vision, or dizziness.   Referral: [facility] (pharmacy)    Please follow up if your symptoms worsen or fail to improve.   Attached with your AVS, you will find valuable resources for self-education. I highly recommend dedicating some time to thoroughly examine them.   Please continue to a heart-healthy diet and increase your  physical activities. Try to exercise for at least five days a week.    It was a pleasure to see you and I look forward to continuing to work together on your health and well-being. Please do not hesitate to call the office if you need care or have questions about your care.  In case of emergency, please visit the Emergency Department for urgent care, or contact our clinic at [phone] to schedule an appointment. We're here to help you!   Have a wonderful day and week. With Gratitude, Elisabet Gutzmer MSN, FNP-BC

## 2024-03-27 NOTE — Progress Notes (Signed)
 Established Patient Office Visit  Subjective:  Patient ID: Laurie Brown, female    DOB: 01-29-1966  Age: 58 y.o. MRN: [id number]  CC:  Chief Complaint  Patient presents with   Hypertension    1 month follow up. Did not start taking the nifedipine . Just got it yesterday and did not take it today after reading the side effects of it     HPI Laurie Brown is a 58 y.o. female presents for f/u of uncontrolled HTN chronic medical conditions.  For the details of today's visit, please refer to the assessment and plan.     Past Medical History:  Diagnosis Date   Diabetes mellitus without complication (HCC)    Hypertension     Past Surgical History:  Procedure Laterality Date   TUBAL LIGATION     TUBAL LIGATION      No family history on file.  Social History   Socioeconomic History   Marital status: Divorced    Spouse name: Not on file   Number of children: Not on file   Years of education: Not on file   Highest education level: Not on file  Occupational History   Not on file  Tobacco Use   Smoking status: Never   Smokeless tobacco: Never  Vaping Use   Vaping status: Never Used  Substance and Sexual Activity   Alcohol use: No   Drug use: No   Sexual activity: Yes    Birth control/protection: Surgical  Other Topics Concern   Not on file  Social History Narrative   Not on file   Social Drivers of Health   Financial Resource Strain: Not on file  Food Insecurity: Not on file  Transportation Needs: Not on file  Physical Activity: Not on file  Stress: Not on file  Social Connections: Not on file  Intimate Partner Violence: Not on file    Outpatient Medications Prior to Visit  Medication Sig Dispense Refill   hydrochlorothiazide  (HYDRODIURIL ) 12.5 MG tablet Take 1 tablet (12.5 mg total) by mouth daily. 30 tablet 3   JARDIANCE  10 MG TABS tablet TAKE 1 TABLET(10 MG) BY MOUTH DAILY 30 tablet 2   metoprolol  tartrate (LOPRESSOR ) 100 MG tablet Take 1 tablet (100  mg total) by mouth 2 (two) times daily. TAKE 1 TABLET(100 MG) BY MOUTH TWICE DAILY 180 tablet 0   rosuvastatin  (CRESTOR ) 10 MG tablet Take 1 tablet (10 mg total) by mouth daily. 90 tablet 0   Vitamin D , Ergocalciferol , (DRISDOL ) 1.25 MG (50000 UNIT) CAPS capsule Take 1 capsule (50,000 Units total) by mouth every 7 (seven) days. 20 capsule 2   olmesartan  (BENICAR ) 20 MG tablet Take 1 tablet (20 mg total) by mouth daily. 30 tablet 0   ibuprofen  (ADVIL ) 800 MG tablet Take 1 tablet (800 mg total) by mouth every 8 (eight) hours as needed for fever, moderate pain or mild pain. 21 tablet 0   NIFEdipine  (PROCARDIA  XL) 30 MG 24 hr tablet Take 1 tablet (30 mg total) by mouth daily. (Patient not taking: Reported on 03/27/2024) 60 tablet 0   No facility-administered medications prior to visit.    Allergies  Allergen Reactions   Lisinopril Cough    ROS Review of Systems  Constitutional:  Negative for chills and fever.  Eyes:  Negative for visual disturbance.  Respiratory:  Negative for chest tightness and shortness of breath.   Neurological:  Negative for dizziness and headaches.      Objective:    Physical Exam HENT:  Head: Normocephalic.     Mouth/Throat:     Mouth: Mucous membranes are moist.  Cardiovascular:     Rate and Rhythm: Normal rate.     Heart sounds: Normal heart sounds.  Pulmonary:     Effort: Pulmonary effort is normal.     Breath sounds: Normal breath sounds.  Neurological:     Mental Status: She is alert.     BP (!) 210/120   Pulse 79   Resp 16   Ht 5' 7 (1.702 m)   Wt 256 lb 1.9 oz (116.2 kg)   LMP 05/06/2016   SpO2 95%   BMI 40.11 kg/m  Wt Readings from Last 3 Encounters:  03/27/24 256 lb 1.9 oz (116.2 kg)  02/18/24 255 lb 1.9 oz (115.7 kg)  10/05/23 251 lb 12.8 oz (114.2 kg)    Lab Results  Component Value Date   TSH 1.740 06/21/2022   Lab Results  Component Value Date   WBC 6.5 06/21/2022   HGB 12.6 06/21/2022   HCT 41.0 06/21/2022   MCV 78  (L) 06/21/2022   PLT 374 06/21/2022   Lab Results  Component Value Date   NA 142 06/21/2022   K 3.8 06/21/2022   CO2 24 06/21/2022   GLUCOSE 73 06/21/2022   BUN 14 06/21/2022   CREATININE 1.10 (H) 06/21/2022   BILITOT 0.3 06/21/2022   ALKPHOS 91 06/21/2022   AST 30 06/21/2022   ALT 31 06/21/2022   PROT 8.1 06/21/2022   ALBUMIN 4.2 06/21/2022   CALCIUM  9.9 06/21/2022   ANIONGAP 9 01/02/2020   EGFR 60 06/21/2022   Lab Results  Component Value Date   CHOL 187 06/21/2022   Lab Results  Component Value Date   HDL 31 (L) 06/21/2022   Lab Results  Component Value Date   LDLCALC 128 (H) 06/21/2022   Lab Results  Component Value Date   TRIG 153 (H) 06/21/2022   Lab Results  Component Value Date   CHOLHDL 6.0 (H) 06/21/2022   Lab Results  Component Value Date   HGBA1C 6.4 (H) 06/28/2023      Assessment & Plan:  Primary hypertension Assessment & Plan: Patient presents with uncontrolled blood pressure in clinic but remains asymptomatic. She reports discontinuing Procardia  (nifedipine ) 30 mg daily due to side effects. Admits to unhealthy eating habits but endorses compliance with Metoprolol  100 mg BID, Olmesartan  20 mg daily, and Hydrochlorothiazide  12.5 mg daily. Treatment regimen was adjusted today in clinic: Olmesartan -amlodipine  40-10 mg daily Metoprolol  100 mg BID Hydrochlorothiazide  12.5 mg daily Referral placed to Cardiology for collaborative care. Referral placed to Pharmacy for medication adherence support, as adherence to current regimen remains uncertain. Lifestyle modifications reinforced: low-sodium diet and increased physical activity encouraged.    Orders: -     Ambulatory referral to [facility] -     AMB Referral [facility] Care Management -     amLODIPine -Olmesartan ; Take 1 tablet by mouth daily.  Dispense: 90 tablet; Refill: 3 -     BMP8+EGFR  Note: This chart has been completed using Engineer, civil (consulting) software, and while  attempts have been made to ensure accuracy, certain words and phrases may not be transcribed as intended.    Follow-up: Return in about 1 month (around 04/26/2024) for BP.   Amnah Breuer, FNP

## 2024-03-28 ENCOUNTER — Ambulatory Visit: Payer: Self-pay | Admitting: Family Medicine

## 2024-03-28 ENCOUNTER — Telehealth: Payer: Self-pay | Admitting: *Deleted

## 2024-03-28 LAB — BMP8+EGFR
BUN/Creatinine Ratio: 14 (ref 9–23)
BUN: 19 mg/dL (ref 6–24)
CO2: 23 mmol/L (ref 20–29)
Calcium: 9.8 mg/dL (ref 8.7–10.2)
Chloride: 102 mmol/L (ref 96–106)
Creatinine, Ser: 1.36 mg/dL — ABNORMAL HIGH (ref 0.57–1.00)
Glucose: 77 mg/dL (ref 70–99)
Potassium: 4 mmol/L (ref 3.5–5.2)
Sodium: 140 mmol/L (ref 134–144)
eGFR: 46 mL/min/1.73 — ABNORMAL LOW (ref >59)

## 2024-03-28 NOTE — Progress Notes (Signed)
 Care Guide Pharmacy Note  03/28/2024 Name: [name] MRN: [id number] DOB: [birth year]  Referred By: Zarwolo, Gloria, FNP Reason for referral: Call Attempt #1 and Complex Care Management (Outreach to schedule referral with pharmacist )   Laurie Brown is a 58 y.o. year old female who is a primary care patient of Zarwolo, Gloria, FNP.  Kortni Hasten was referred to the pharmacist for assistance related to: HTN  Successful contact was made with the patient to discuss pharmacy services including being ready for the pharmacist to call at least 5 minutes before the scheduled appointment time and to have medication bottles and any blood pressure readings ready for review. The patient agreed to meet with the pharmacist via telephone visit on 04/02/2024  Thedford Franks, CMA [place]  [facility], [facility] Guide Direct Dial: [phone]  Fax: [phone] Website: [email or web address]

## 2024-03-28 NOTE — Progress Notes (Signed)
 Please inform the patient to increase hydration, aiming for at least 64 ounces of water daily.

## 2024-04-02 ENCOUNTER — Other Ambulatory Visit

## 2024-04-03 ENCOUNTER — Telehealth: Payer: Self-pay

## 2024-04-03 NOTE — Progress Notes (Signed)
 Complex Care Management Care Guide Note  04/03/2024 Name: Laurie Brown MRN: [id number] DOB: February 14, 1966  Laurie Brown is a 58 y.o. year old female who is a primary care patient of Zarwolo, Gloria, FNP and is actively engaged with the care management team. I reached out to Olam Maxcy by phone today to assist with re-scheduling  with the Pharmacist.  Follow up plan: Unsuccessful telephone outreach attempt made. A HIPAA compliant phone message was left for the patient providing contact information and requesting a return call.  Jeoffrey Buffalo , RMA     [facility]  [facility], [facility] Guide  Direct Dial: [phone]  Website: [email or web address]

## 2024-04-08 ENCOUNTER — Other Ambulatory Visit: Payer: Self-pay | Admitting: Family Medicine

## 2024-04-08 DIAGNOSIS — I10 Essential (primary) hypertension: Secondary | ICD-10-CM

## 2024-04-10 ENCOUNTER — Other Ambulatory Visit

## 2024-04-23 ENCOUNTER — Ambulatory Visit: Payer: MEDICAID

## 2024-04-23 NOTE — Progress Notes (Signed)
 Patient is in office today for a nurse visit for Blood Pressure Check. Patients B/P on her right arm was 163/85. And 144/85 on the left arm

## 2024-04-28 ENCOUNTER — Other Ambulatory Visit (HOSPITAL_COMMUNITY): Payer: Self-pay

## 2024-04-28 ENCOUNTER — Other Ambulatory Visit: Payer: Self-pay | Admitting: Family Medicine

## 2024-04-28 ENCOUNTER — Telehealth: Payer: Self-pay | Admitting: Pharmacy Technician

## 2024-04-28 DIAGNOSIS — E119 Type 2 diabetes mellitus without complications: Secondary | ICD-10-CM

## 2024-04-28 MED ORDER — EMPAGLIFLOZIN 10 MG PO TABS: 10.0000 mg | ORAL_TABLET | Freq: Every day | ORAL | 2 refills | Status: DC

## 2024-04-28 NOTE — Telephone Encounter (Signed)
 Copied from CRM [id number]. Topic: Clinical - Medication Refill >> Apr 28, 2024 10:15 AM Rosaria BRAVO wrote: Medication: JARDIANCE  10 MG TABS tablet  Has the patient contacted their pharmacy? Yes (Agent: If no, request that the patient contact the pharmacy for the refill. If patient does not wish to contact the pharmacy document the reason why and proceed with request.) (Agent: If yes, when and what did the pharmacy advise?)  This is the patient's preferred pharmacy:  [facility] [address] [place] - [address] [place] [address] Phone: [phone] Fax: [phone]  Is this the correct pharmacy for this prescription? Yes If no, delete pharmacy and type the correct one.   Has the prescription been filled recently? Yes  Is the patient out of the medication? Yes  Has the patient been seen for an appointment in the last year OR does the patient have an upcoming appointment? Yes  Can we respond through MyChart? Yes  Agent: Please be advised that Rx refills may take up to 3 business days. We ask that you follow-up with your pharmacy.

## 2024-04-28 NOTE — Telephone Encounter (Signed)
 Pharmacy Patient Advocate Encounter   Received notification from CoverMyMeds that prior authorization for Jardiance  10MG  tablets is required/requested.   Insurance verification completed.   The patient is insured through [employer] [place] [employer].   Per test claim: PA required; PA submitted to above mentioned insurance via Latent Key/confirmation #/[id number] Status is pending

## 2024-04-29 ENCOUNTER — Other Ambulatory Visit (HOSPITAL_COMMUNITY): Payer: Self-pay

## 2024-04-29 NOTE — Telephone Encounter (Signed)
 Pharmacy Patient Advocate Encounter  Received notification from [employer] [place] [employer] that Prior Authorization for Jardiance  10MG  tablets has been APPROVED from 04/28/2024 to 04/28/2025. Ran test claim, Copay is $4.00. This test claim was processed through [facility]- copay amounts may vary at other pharmacies due to pharmacy/plan contracts, or as the patient moves through the different stages of their insurance plan.   PA #/Case ID/Reference #: [id number]

## 2024-05-01 ENCOUNTER — Other Ambulatory Visit

## 2024-05-10 ENCOUNTER — Other Ambulatory Visit: Payer: Self-pay | Admitting: Family Medicine

## 2024-05-10 DIAGNOSIS — I10 Essential (primary) hypertension: Secondary | ICD-10-CM

## 2024-05-17 ENCOUNTER — Other Ambulatory Visit: Payer: Self-pay | Admitting: Family Medicine

## 2024-05-17 DIAGNOSIS — I10 Essential (primary) hypertension: Secondary | ICD-10-CM

## 2024-05-22 ENCOUNTER — Other Ambulatory Visit

## 2024-05-22 ENCOUNTER — Telehealth: Payer: Self-pay

## 2024-05-22 NOTE — Progress Notes (Signed)
   05/22/2024  Patient ID: Laurie Brown, female   DOB: [birth year], 58 y.o.   MRN: [id number]  Attempted patient regarding medication management for 2pm appt. No answer. Should patient return call I can be reached at the number below.   Lang Sieve, PharmD, BCGP Clinical Pharmacist  [phone]

## 2024-05-26 ENCOUNTER — Other Ambulatory Visit: Payer: Self-pay | Admitting: Family Medicine

## 2024-05-26 DIAGNOSIS — I10 Essential (primary) hypertension: Secondary | ICD-10-CM

## 2024-05-26 MED ORDER — METOPROLOL TARTRATE 100 MG PO TABS: 100.0000 mg | ORAL_TABLET | Freq: Two times a day (BID) | ORAL | 0 refills | Status: DC

## 2024-05-26 NOTE — Telephone Encounter (Unsigned)
 Copied from CRM [id number]. Topic: Clinical - Medication Refill >> May 26, 2024  1:30 PM Olam RAMAN wrote: Medication: metoprolol  tartrate (LOPRESSOR ) 100 MG tablet   Has the patient contacted their pharmacy? Yes (Agent: If no, request that the patient contact the pharmacy for the refill. If patient does not wish to contact the pharmacy document the reason why and proceed with request.) (Agent: If yes, when and what did the pharmacy advise?)  This is the patient's preferred pharmacy:  [facility] [address], [place] - [address] [place] [address] [place] [address] Phone: [phone] Fax: [phone]  [place] [facility] [address] [place] [address] Phone: [phone] Fax: [phone]  Is this the correct pharmacy for this prescription? Yes If no, delete pharmacy and type the correct one.   Has the prescription been filled recently? Yes  Is the patient out of the medication? Yes  Has the patient been seen for an appointment in the last year OR does the patient have an upcoming appointment? Yes  Can we respond through MyChart? Yes  Agent: Please be advised that Rx refills may take up to 3 business days. We ask that you follow-up with your pharmacy.

## 2024-06-13 ENCOUNTER — Other Ambulatory Visit: Payer: Self-pay | Admitting: Family Medicine

## 2024-06-13 DIAGNOSIS — I10 Essential (primary) hypertension: Secondary | ICD-10-CM

## 2024-07-06 ENCOUNTER — Other Ambulatory Visit: Payer: Self-pay | Admitting: Family Medicine

## 2024-07-06 DIAGNOSIS — I10 Essential (primary) hypertension: Secondary | ICD-10-CM

## 2024-07-21 ENCOUNTER — Other Ambulatory Visit: Payer: Self-pay | Admitting: Family Medicine

## 2024-07-21 DIAGNOSIS — I10 Essential (primary) hypertension: Secondary | ICD-10-CM

## 2024-07-21 DIAGNOSIS — E119 Type 2 diabetes mellitus without complications: Secondary | ICD-10-CM

## 2024-07-24 ENCOUNTER — Ambulatory Visit: Payer: MEDICAID | Admitting: Family Medicine

## 2024-08-04 ENCOUNTER — Ambulatory Visit: Payer: MEDICAID | Admitting: Family Medicine

## 2024-08-14 ENCOUNTER — Telehealth: Payer: MEDICAID | Admitting: Family Medicine

## 2024-08-14 DIAGNOSIS — R0683 Snoring: Secondary | ICD-10-CM

## 2024-08-15 DIAGNOSIS — R0683 Snoring: Secondary | ICD-10-CM | POA: Insufficient documentation

## 2024-08-15 NOTE — Assessment & Plan Note (Signed)
 Evaluate for possible sleep-disordered breathing, given reports of snoring and excessive daytime sleepiness. Order a  home sleep study to assess for obstructive sleep apnea or other sleep disorders. Encourage good sleep hygiene, including maintaining a consistent sleep schedule, limiting caffeine and screen time before bed, and avoiding alcohol or sedatives at night. Advise the patient to avoid driving or operating heavy machinery if feeling excessively drowsy. Review current medications and medical conditions that may contribute to daytime sleepiness.  Schedule follow-up to review sleep study results and discuss further management options.

## 2024-08-17 ENCOUNTER — Other Ambulatory Visit: Payer: Self-pay | Admitting: Family Medicine

## 2024-08-17 DIAGNOSIS — I10 Essential (primary) hypertension: Secondary | ICD-10-CM

## 2024-08-17 DIAGNOSIS — E119 Type 2 diabetes mellitus without complications: Secondary | ICD-10-CM

## 2024-08-18 ENCOUNTER — Other Ambulatory Visit: Payer: Self-pay | Admitting: Family Medicine

## 2024-08-18 DIAGNOSIS — I10 Essential (primary) hypertension: Secondary | ICD-10-CM

## 2025-01-12 ENCOUNTER — Ambulatory Visit: Payer: Self-pay
